# Patient Record
Sex: Female | Born: 1940 | Race: Black or African American | Hispanic: No | Smoking: Never smoker
Health system: Southern US, Community
[De-identification: ages and names within clinical notes are randomized; demographics above are authoritative.]

## PROBLEM LIST (undated history)

## (undated) DIAGNOSIS — I10 Essential (primary) hypertension: Secondary | ICD-10-CM

## (undated) DIAGNOSIS — Z8049 Family history of malignant neoplasm of other genital organs: Secondary | ICD-10-CM

## (undated) DIAGNOSIS — Z800 Family history of malignant neoplasm of digestive organs: Secondary | ICD-10-CM

## (undated) DIAGNOSIS — B019 Varicella without complication: Secondary | ICD-10-CM

## (undated) DIAGNOSIS — Z806 Family history of leukemia: Secondary | ICD-10-CM

## (undated) DIAGNOSIS — Z8042 Family history of malignant neoplasm of prostate: Secondary | ICD-10-CM

## (undated) DIAGNOSIS — C50919 Malignant neoplasm of unspecified site of unspecified female breast: Secondary | ICD-10-CM

## (undated) DIAGNOSIS — Z923 Personal history of irradiation: Secondary | ICD-10-CM

## (undated) DIAGNOSIS — J302 Other seasonal allergic rhinitis: Secondary | ICD-10-CM

## (undated) HISTORY — DX: Varicella without complication: B01.9

## (undated) HISTORY — DX: Family history of leukemia: Z80.6

## (undated) HISTORY — DX: Other seasonal allergic rhinitis: J30.2

## (undated) HISTORY — DX: Family history of malignant neoplasm of digestive organs: Z80.0

## (undated) HISTORY — DX: Family history of malignant neoplasm of other genital organs: Z80.49

## (undated) HISTORY — PX: BREAST LUMPECTOMY: SHX2

## (undated) HISTORY — DX: Family history of malignant neoplasm of prostate: Z80.42

## (undated) HISTORY — DX: Malignant neoplasm of unspecified site of unspecified female breast: C50.919

---

## 1990-04-11 HISTORY — PX: BREAST BIOPSY: SHX20

## 1998-05-13 ENCOUNTER — Encounter: Payer: Self-pay | Admitting: Emergency Medicine

## 1998-05-13 ENCOUNTER — Emergency Department (HOSPITAL_COMMUNITY): Admission: EM | Admit: 1998-05-13 | Discharge: 1998-05-13 | Payer: Self-pay | Admitting: Emergency Medicine

## 2000-06-01 ENCOUNTER — Emergency Department (HOSPITAL_COMMUNITY): Admission: EM | Admit: 2000-06-01 | Discharge: 2000-06-01 | Payer: Self-pay | Admitting: Emergency Medicine

## 2001-09-06 ENCOUNTER — Encounter: Admission: RE | Admit: 2001-09-06 | Discharge: 2001-09-06 | Payer: Self-pay | Admitting: Radiation Oncology

## 2002-10-08 LAB — HM MAMMOGRAPHY

## 2003-11-09 ENCOUNTER — Emergency Department (HOSPITAL_COMMUNITY): Admission: EM | Admit: 2003-11-09 | Discharge: 2003-11-09 | Payer: Self-pay | Admitting: Family Medicine

## 2005-11-14 ENCOUNTER — Emergency Department (HOSPITAL_COMMUNITY): Admission: EM | Admit: 2005-11-14 | Discharge: 2005-11-14 | Payer: Self-pay | Admitting: Family Medicine

## 2012-05-10 ENCOUNTER — Encounter: Payer: Self-pay | Admitting: Family Medicine

## 2012-06-07 ENCOUNTER — Ambulatory Visit (INDEPENDENT_AMBULATORY_CARE_PROVIDER_SITE_OTHER): Payer: Medicare Other | Admitting: Family Medicine

## 2012-06-07 ENCOUNTER — Telehealth: Payer: Self-pay | Admitting: Family Medicine

## 2012-06-07 ENCOUNTER — Encounter: Payer: Self-pay | Admitting: Family Medicine

## 2012-06-07 VITALS — BP 200/100 | HR 75 | Temp 98.6°F | Ht 65.25 in | Wt 147.0 lb

## 2012-06-07 DIAGNOSIS — E785 Hyperlipidemia, unspecified: Secondary | ICD-10-CM

## 2012-06-07 DIAGNOSIS — R030 Elevated blood-pressure reading, without diagnosis of hypertension: Secondary | ICD-10-CM

## 2012-06-07 DIAGNOSIS — Z131 Encounter for screening for diabetes mellitus: Secondary | ICD-10-CM

## 2012-06-07 DIAGNOSIS — R739 Hyperglycemia, unspecified: Secondary | ICD-10-CM

## 2012-06-07 DIAGNOSIS — R7309 Other abnormal glucose: Secondary | ICD-10-CM

## 2012-06-07 DIAGNOSIS — Z7689 Persons encountering health services in other specified circumstances: Secondary | ICD-10-CM

## 2012-06-07 DIAGNOSIS — Z7189 Other specified counseling: Secondary | ICD-10-CM

## 2012-06-07 LAB — BASIC METABOLIC PANEL
BUN: 11 mg/dL (ref 6–23)
CO2: 29 mEq/L (ref 19–32)
Calcium: 9.4 mg/dL (ref 8.4–10.5)
Chloride: 103 mEq/L (ref 96–112)
Creatinine, Ser: 0.7 mg/dL (ref 0.4–1.2)
GFR: 113.5 mL/min (ref >60.00)
Glucose, Bld: 79 mg/dL (ref 70–99)
Potassium: 4.7 mEq/L (ref 3.5–5.1)
Sodium: 139 mEq/L (ref 135–145)

## 2012-06-07 LAB — LIPID PANEL
Cholesterol: 182 mg/dL (ref 0–200)
HDL: 65.5 mg/dL (ref >39.00)
LDL Cholesterol: 107 mg/dL — ABNORMAL HIGH (ref 0–99)
Total CHOL/HDL Ratio: 3
Triglycerides: 48 mg/dL (ref 0.0–149.0)
VLDL: 9.6 mg/dL (ref 0.0–40.0)

## 2012-06-07 LAB — HEMOGLOBIN A1C: Hgb A1c MFr Bld: 5.5 % (ref 4.6–6.5)

## 2012-06-07 NOTE — Telephone Encounter (Signed)
Labs look good.

## 2012-06-07 NOTE — Patient Instructions (Addendum)
-  We have ordered labs or studies at this visit. It can take up to 1-2 weeks for results and processing. We will contact you with instructions IF your results are abnormal. Normal results will be released to your [facility]. If you have not heard from [place] or can not find your results in [facility] in 2 weeks please contact our office.  -PLEASE SIGN UP FOR MYCHART TODAY   We recommend the following healthy lifestyle measures: - eat a healthy diet consisting of lots of vegetables, fruits, beans, nuts, seeds, healthy meats such as white chicken and fish and whole grains.  - avoid fried foods, fast food, processed foods, sodas, red meet and other fattening foods.  - get a least 150 minutes of aerobic exercise per week.   Follow up in: 1 month for you blood pressure or sooner if any symptoms

## 2012-06-07 NOTE — Progress Notes (Signed)
Chief Complaint  Patient presents with  . Establish Care    HPI:  Kristina Bridges is here to establish care.  Last PCP and physical: Used to see Dr. Dennard Nip - but has not seen a doctor at all in abut eight years.   Has the following chronic problems and concerns today:  There is no problem list on file for this patient.  Hx of breast cancer: -reports she used to get mammogram and right after a normal mammogram she found a breast lump in the shower and was dx with breast cancer. Refuses mammograms. She does self breast exams several times a month since in the shower.  Health Maintenance: -refuses all health maintenance measure  ROS: See pertinent positives and negatives per HPI. Reports feels great - denies CP, SOB, DOE, swelling, palpitations, changes in bowels or weight, vision changes, HAs,  urinary symptoms.  Past Medical History  Diagnosis Date  . Chicken pox   . Seasonal allergies   . Breast cancer     remote, tx with surgery and radiation - no recurrence    Family History  Problem Relation Age of Onset  . Hypertension    . Hypertension Mother   . Cancer Sister   . Cancer Brother     throat    History   Social History  . Marital Status: Divorced    Spouse Name: N/A    Number of Children: N/A  . Years of Education: N/A   Social History Main Topics  . Smoking status: Never Smoker   . Smokeless tobacco: None  . Alcohol Use: No  . Drug Use: None  . Sexually Active: None   Other Topics Concern  . None   Social History Narrative   Work or School: retired, used to work in [employer] Situation: lives alone - raised a grandson but he has moved out, he comes to visit, has a lot of good friends      Spiritual Beliefs: Christian, haas good support      Lifestyle: exercises every day and eat healthy             Current outpatient prescriptions:Calcium-Magnesium-Vitamin D 500-50-100 MG-MG-UNIT CHEW, Chew by mouth., Disp: , Rfl:    EXAM:  Filed Vitals:   06/07/12 1244  BP: 200/100  Pulse: 75  Temp: 98.6 F (37 C)    Body mass index is 24.29 kg/(m^2).  GENERAL: vitals reviewed and listed above, alert, oriented, appears well hydrated and in no acute distress  HEENT: atraumatic, conjunttiva clear, no obvious abnormalities on inspection of external nose and ears  NECK: no obvious masses on inspection  LUNGS: clear to auscultation bilaterally, no wheezes, rales or rhonchi, good air movement  CV: HRRR, no peripheral edema  MS: moves all extremities without noticeable abnormality  PSYCH: pleasant and cooperative, no obvious depression or anxiety  ASSESSMENT AND PLAN:  Discussed the following assessment and plan:  Elevated blood pressure - Plan: Lipid Panel, Basic metabolic panel, Hemoglobin A1c  Establishing care with new doctor, encounter for  Diabetes mellitus screening - Plan: Hemoglobin A1c  Hyperlipemia - Plan: Lipid Panel  Hyperglycemia - Plan: Hemoglobin A1c  -We reviewed the PMH, PSH, FH, SH, Meds and Allergies. -her blood pressure is high and is 172/100 on recheck - I advised we check labs and start a blood pressure medication today. She has a FH of HTN. She refused to start medication and wants to work on exercising more and  a healthier diet. She is aware of risks of untreated blood pressure including heart disease, stroke and other issues. She will follow up in 2-4 weeks to recheck and advised starting blood pressure medication is remains elevated. -NON FASTING LABS TODAY -she refuses all health maintenance advice and will continue self breast exams.  -Patient advised to return or notify a doctor immediately if symptoms worsen or persist or new concerns arise.  Patient Instructions  -We have ordered labs or studies at this visit. It can take up to 1-2 weeks for results and processing. We will contact you with instructions IF your results are abnormal. Normal results will be released to  your [facility]. If you have not heard from [place] or can not find your results in [facility] in 2 weeks please contact our office.  -PLEASE SIGN UP FOR MYCHART TODAY   We recommend the following healthy lifestyle measures: - eat a healthy diet consisting of lots of vegetables, fruits, beans, nuts, seeds, healthy meats such as white chicken and fish and whole grains.  - avoid fried foods, fast food, processed foods, sodas, red meet and other fattening foods.  - get a least 150 minutes of aerobic exercise per week.   Follow up in: 1 month for you blood pressure or sooner if any symptoms      [name]

## 2012-06-08 NOTE — Telephone Encounter (Signed)
Called and spoke with pt and pt is aware.  

## 2012-06-08 NOTE — Telephone Encounter (Signed)
Left a message for return call.  

## 2012-07-09 ENCOUNTER — Ambulatory Visit: Payer: Medicare Other | Admitting: Family Medicine

## 2013-06-14 ENCOUNTER — Ambulatory Visit: Payer: Medicare Other | Admitting: Family Medicine

## 2014-02-10 ENCOUNTER — Encounter: Payer: Self-pay | Admitting: Family Medicine

## 2014-02-20 ENCOUNTER — Ambulatory Visit (INDEPENDENT_AMBULATORY_CARE_PROVIDER_SITE_OTHER): Payer: Medicare Other | Admitting: Family Medicine

## 2014-02-20 ENCOUNTER — Telehealth: Payer: Self-pay | Admitting: Family Medicine

## 2014-02-20 ENCOUNTER — Encounter: Payer: Self-pay | Admitting: Family Medicine

## 2014-02-20 VITALS — BP 142/98 | HR 76 | Temp 97.3°F | Ht 65.25 in | Wt 141.6 lb

## 2014-02-20 DIAGNOSIS — Z0000 Encounter for general adult medical examination without abnormal findings: Secondary | ICD-10-CM

## 2014-02-20 DIAGNOSIS — G3184 Mild cognitive impairment, so stated: Secondary | ICD-10-CM

## 2014-02-20 DIAGNOSIS — I10 Essential (primary) hypertension: Secondary | ICD-10-CM

## 2014-02-20 MED ORDER — AMLODIPINE BESYLATE 5 MG PO TABS: 5.0000 mg | ORAL_TABLET | Freq: Every day | ORAL | Status: DC

## 2014-02-20 NOTE — Patient Instructions (Addendum)
BEFORE YOU LEAVE: -schedule follow up in 1 month to recheck you blood pressure -lab check  START taking the new medication - NORVASC 5mg  daily  Please see a lawyer and/or go to this website to help you with advanced directives and designating a health care power of attorney so that your wishes will be followed should you become too ill to make your own medical decisions.  Proor.no  We recommend the following healthy lifestyle measures: - eat a healthy diet consisting of lots of vegetables, fruits, beans, nuts, seeds, healthy meats such as white chicken and fish and whole grains.  - avoid fried foods, fast food, processed foods, sodas, red meet and other fattening foods.  - get a least 150 minutes of aerobic exercise per week.

## 2014-02-20 NOTE — Telephone Encounter (Signed)
Sent!

## 2014-02-20 NOTE — Telephone Encounter (Signed)
Pharm following up for pt.  Pt is supposed to start NORVASC 5mg  daily Can you call to [facility]

## 2014-02-20 NOTE — Progress Notes (Signed)
Pre visit review using our clinic review tool, if applicable. No additional management support is needed unless otherwise documented below in the visit note. 

## 2014-02-20 NOTE — Progress Notes (Signed)
Medicare Annual Preventive Care Visit  (initial annual wellness or annual wellness exam)  Concerns and/or follow up today:  In the past has refused all preventive measures.  Hx of breast cancer: -reports she used to get mammogram and right after a normal mammogram she found a breast lump in the shower and was dx with breast cancer. Refuses mammograms. She does self breast exams several times a month since in the shower.  HTN: -refused treatment last visit even after warned of serious risks including stroke, heart disease, kidney disease and death, did not follow up as advised -she is very nervous about side effects with BP -denies: CP, SOB, DOE, swelling, palpitations, HA, vision changes  ROS: negative for report of fevers, unintentional weight loss, vision changes, vision loss, hearing loss or change, chest pain, sob, hemoptysis, melena, hematochezia, hematuria, genital discharge or lesions, falls, bleeding or bruising, loc, thoughts of suicide or self harm, memory loss  1.) Patient-completed health risk assessment  - completed and reviewed, see scanned documentation  2.) Review of Medical History: -PMH, PSH, Family History and current specialty and care providers reviewed and updated and listed below  - see scanned in document in chart and below  Past Medical History  Diagnosis Date  . Chicken pox   . Seasonal allergies   . Breast cancer     remote, tx with surgery and radiation - no recurrence    Past Surgical History  Procedure Laterality Date  . Breast biopsy  1992    History   Social History  . Marital Status: Divorced    Spouse Name: N/A    Number of Children: N/A  . Years of Education: N/A   Occupational History  . Not on file.   Social History Main Topics  . Smoking status: Never Smoker   . Smokeless tobacco: Not on file  . Alcohol Use: No  . Drug Use: Not on file  . Sexual Activity: Not on file   Other Topics Concern  . Not on file   Social History  Narrative   Work or School: retired, used to work in [employer] Situation: lives alone - raised a grandson but he has moved out, he comes to visit, has a lot of good friends      Spiritual Beliefs: Christian, haas good support      Lifestyle: exercises every day and eat healthy             The patient has a family history of  3.) Review of functional ability and level of safety:  Any difficulty hearing? NO  History of falling? NO  Any trouble with IADLs - using a phone, using transportation, grocery shopping, preparing meals, doing housework, doing laundry, taking medications and managing money? NO  Advance Directives? NO  See summary of recommendations in Patient Instructions below.  4.) Physical Exam Filed Vitals:   02/20/14 0913  BP: 142/98  Pulse: 76  Temp: 97.3 F (36.3 C)   Estimated body mass index is 23.39 kg/(m^2) as calculated from the following:   Height as of this encounter: 5' 5.25" (1.657 m).   Weight as of this encounter: 141 lb 9.6 oz (64.229 kg).  EKG (optional): deferred  General: alert, appear well hydrated and in no acute distress  HEENT: visual acuity grossly intact  CV: HRRR  Lungs: CTA bilaterally  Psych: pleasant and cooperative, no obvious depression or anxiety  Mini Cog: 1. Patient instructed to listen carefully and  repeat the following: Apple Watch    Penny  2. Clock drawing test was administered: NORMAL     ABNORMAL  3. Recall of three words  Scoring:  Number of words recalled? 2/3 If 3 STOP = negative screen for cognitive impairment If 1-2 --> is CDT normal or abnormal? Mildly abnormal.  Patient Score: POS  See patient instructions for recommendations.  Education and counseling regarding the above review of health provided with a plan for the following: -see scanned patient completed form for further details -fall prevention strategies discussed  -healthy lifestyle discussed -importance and  resources for completing advanced directives discussed, information provieded -see patient instructions below for any other recommendations provided  4)The following written screening schedule of preventive measures were reviewed with assessment and plan made per below, orders and patient instructions:      AAA screening: n/a     Alcohol screening: done     Obesity Screening and counseling: done     STI screening: offered, declined     Tobacco Screening: done       Pneumococcal (PPSV23 -one dose after 64, one before if risk factors), influenza yearly and hepatitis B vaccines (if high risk - end stage renal disease, IV drugs, homosexual men, live in home for mentally retarded, hemophilia receiving factors) ASSESSMENT/PLAN: refuses all vaccines      Screening mammograph (yearly if >40) ASSESSMENT/PLAN: refused      Screening Pap smear/pelvic exam (q2 years) ASSESSMENT/PLAN: n/a      Prostate cancer screening ASSESSMENT/PLAN: n/a      Colorectal cancer screening (FOBT yearly or flex sig q4y or colonoscopy q10y or barium enema q4y) ASSESSMENT/PLAN: refuses      Diabetes outpatient self-management training services ASSESSMENT/PLAN: n/a      Bone mass measurements(covered q2y if indicated - estrogen def, osteoporosis, hyperparathyroid, vertebral abnormalities, osteoporosis or steroids) ASSESSMENT/PLAN: refused      Screening for glaucoma(q1y if high risk - diabetes, FH, AA and > 50 or hispanic and > 65) ASSESSMENT/PLAN: sees eye doctor      Medical nutritional therapy for individuals with diabetes or renal disease ASSESSMENT/PLAN: n/a      Cardiovascular screening blood tests (lipids q5y) ASSESSMENT/PLAN: done      Diabetes screening tests ASSESSMENT/PLAN: done   7.) Summary: -risk factors and conditions per above assessment were discussed and treatment, recommendations and referrals were offered per documentation above and orders and patient instructions.  Medicare annual  wellness visit, initial  Essential hypertension -we again discussed her significant HTN and she finally agreed to start a medication -after discussion risks and benefits she opted for norvasc -she wants to keep working on diet and exercise too  Mild cognitive impairment -discussed, she refused any further evaluation for this  Patient Instructions  BEFORE YOU LEAVE: -schedule follow up in 1 month to recheck you blood pressure -lab check  START taking the new medication - NORVASC 5mg  daily  Please see a lawyer and/or go to this website to help you with advanced directives and designating a health care power of attorney so that your wishes will be followed should you become too ill to make your own medical decisions.  Proor.no  We recommend the following healthy lifestyle measures: - eat a healthy diet consisting of lots of vegetables, fruits, beans, nuts, seeds, healthy meats such as white chicken and fish and whole grains.  - avoid fried foods, fast food, processed foods, sodas, red meet and other fattening foods.  - get a least 150 minutes  of aerobic exercise per week.

## 2014-02-25 NOTE — Progress Notes (Signed)
Scheduled for appt.

## 2014-02-26 ENCOUNTER — Telehealth: Payer: Self-pay | Admitting: Family Medicine

## 2014-02-26 NOTE — Telephone Encounter (Signed)
emmi emailed °

## 2014-03-21 ENCOUNTER — Encounter: Payer: Self-pay | Admitting: Family Medicine

## 2014-03-21 ENCOUNTER — Ambulatory Visit (INDEPENDENT_AMBULATORY_CARE_PROVIDER_SITE_OTHER): Payer: Medicare Other | Admitting: Family Medicine

## 2014-03-21 VITALS — BP 126/82 | HR 68 | Temp 98.1°F | Ht 65.25 in | Wt 140.9 lb

## 2014-03-21 DIAGNOSIS — I10 Essential (primary) hypertension: Secondary | ICD-10-CM

## 2014-03-21 NOTE — Progress Notes (Signed)
Pre visit review using our clinic review tool, if applicable. No additional management support is needed unless otherwise documented below in the visit note. 

## 2014-03-21 NOTE — Progress Notes (Signed)
  HPI:  Follow up:   HTN: -finally agreed to start medication 02/2014 with Norvasc 5 mg -reports: doing well, taking daily  -denies:CP, SOB, swelling -she reports she is always anxious when comes, reports home BP in 1teen-124 /80s at home  ROS: See pertinent positives and negatives per HPI.  Past Medical History  Diagnosis Date  . Chicken pox   . Seasonal allergies   . Breast cancer     remote, tx with surgery and radiation - no recurrence    Past Surgical History  Procedure Laterality Date  . Breast biopsy  1992    Family History  Problem Relation Age of Onset  . Hypertension    . Hypertension Mother   . Cancer Sister   . Cancer Brother     throat    History   Social History  . Marital Status: Divorced    Spouse Name: N/A    Number of Children: N/A  . Years of Education: N/A   Social History Main Topics  . Smoking status: Never Smoker   . Smokeless tobacco: None  . Alcohol Use: No  . Drug Use: None  . Sexual Activity: None   Other Topics Concern  . None   Social History Narrative   Work or School: retired, used to work in [employer] Situation: lives alone - raised a grandson but he has moved out, he comes to visit, has a lot of good friends      Spiritual Beliefs: Christian, haas good support      Lifestyle: exercises every day and eat healthy             Current outpatient prescriptions: acetaminophen (TYLENOL) 500 MG tablet, Take 500 mg by mouth as needed., Disp: , Rfl: ;  amLODipine (NORVASC) 5 MG tablet, Take 1 tablet (5 mg total) by mouth daily., Disp: 90 tablet, Rfl: 3;  Multiple Vitamin (MULTIVITAMIN) capsule, Take 1 capsule by mouth daily., Disp: , Rfl:   EXAM:  Filed Vitals:   03/21/14 1259  BP: 126/82  Pulse: 68  Temp: 98.1 F (36.7 C)    Body mass index is 23.28 kg/(m^2).  GENERAL: vitals reviewed and listed above, alert, oriented, appears well hydrated and in no acute distress  HEENT: atraumatic, conjunttiva  clear, no obvious abnormalities on inspection of external nose and ears  NECK: no obvious masses on inspection  LUNGS: clear to auscultation bilaterally, no wheezes, rales or rhonchi, good air movement  CV: HRRR, no peripheral edema  MS: moves all extremities without noticeable abnormality  PSYCH: pleasant and cooperative, no obvious depression or anxiety  ASSESSMENT AND PLAN:  Discussed the following assessment and plan:  Essential hypertension  -BP great on recheck -she did not do BMP last visit - advised to get today - she refused -reviewed HM and she refused -follow up 4-6 months -Patient advised to return or notify a doctor immediately if symptoms worsen or persist or new concerns arise.  There are no Patient Instructions on file for this visit.   Colin Benton R.

## 2014-08-29 ENCOUNTER — Telehealth: Payer: Self-pay | Admitting: *Deleted

## 2014-08-29 NOTE — Telephone Encounter (Signed)
Left message for pt to call back.  Need to know if pt had mammogram, when and where

## 2014-09-18 ENCOUNTER — Encounter: Payer: Self-pay | Admitting: Family Medicine

## 2014-09-18 ENCOUNTER — Ambulatory Visit (INDEPENDENT_AMBULATORY_CARE_PROVIDER_SITE_OTHER): Payer: Medicare Other | Admitting: Family Medicine

## 2014-09-18 VITALS — BP 130/82 | HR 68 | Temp 97.6°F | Ht 65.25 in | Wt 143.4 lb

## 2014-09-18 DIAGNOSIS — J309 Allergic rhinitis, unspecified: Secondary | ICD-10-CM | POA: Diagnosis not present

## 2014-09-18 DIAGNOSIS — Z1239 Encounter for other screening for malignant neoplasm of breast: Secondary | ICD-10-CM | POA: Diagnosis not present

## 2014-09-18 DIAGNOSIS — I10 Essential (primary) hypertension: Secondary | ICD-10-CM | POA: Diagnosis not present

## 2014-09-18 NOTE — Progress Notes (Signed)
Pre visit review using our clinic review tool, if applicable. No additional management support is needed unless otherwise documented below in the visit note. 

## 2014-09-18 NOTE — Progress Notes (Signed)
HPI:  Follow up:  HTN/Mild HLD: -finally agreed to start medication 02/2014 with Norvasc 5 mg -reports: doing well, taking daily  -denies:CP, SOB, swelling -she reports she is always anxious when comes, reports  home BP in 1teen-124 /80s at home -she exercises and walks daily  Seasonal allergies: -reports: doing well -denies: symptoms this season  HM: -mammo: she was reluctant but she did agree and referral placed -dexa: refused .pneumonia vaccine: refused -Tdap: refused -shingles: refused  ROS: See pertinent positives and negatives per HPI.  Past Medical History  Diagnosis Date  . Chicken pox   . Seasonal allergies   . Breast cancer     remote, tx with surgery and radiation - no recurrence    Past Surgical History  Procedure Laterality Date  . Breast biopsy  1992    Family History  Problem Relation Age of Onset  . Hypertension    . Hypertension Mother   . Cancer Sister   . Cancer Brother     throat    History   Social History  . Marital Status: Divorced    Spouse Name: N/A  . Number of Children: N/A  . Years of Education: N/A   Social History Main Topics  . Smoking status: Never Smoker   . Smokeless tobacco: Not on file  . Alcohol Use: No  . Drug Use: Not on file  . Sexual Activity: Not on file   Other Topics Concern  . None   Social History Narrative   Work or School: retired, used to work in [employer] Situation: lives alone - raised a grandson but he has moved out, he comes to visit, has a lot of good friends - two daughters sylvia and zena with health issues      Spiritual Beliefs: Christian, haas good support      Lifestyle: exercises every day and eat healthy              Current outpatient prescriptions:  .  acetaminophen (TYLENOL) 500 MG tablet, Take 500 mg by mouth as needed., Disp: , Rfl:  .  amLODipine (NORVASC) 5 MG tablet, Take 1 tablet (5 mg total) by mouth daily., Disp: 90 tablet, Rfl: 3 .  Multiple  Vitamin (MULTIVITAMIN) capsule, Take 1 capsule by mouth daily., Disp: , Rfl:   EXAM:  Filed Vitals:   09/18/14 1251  BP: 130/82  Pulse: 68  Temp: 97.6 F (36.4 C)    Body mass index is 23.69 kg/(m^2).  GENERAL: vitals reviewed and listed above, alert, oriented, appears well hydrated and in no acute distress  HEENT: atraumatic, conjunttiva clear, no obvious abnormalities on inspection of external nose and ears  NECK: no obvious masses on inspection  LUNGS: clear to auscultation bilaterally, no wheezes, rales or rhonchi, good air movement  CV: HRRR, no peripheral edema  MS: moves all extremities without noticeable abnormality  PSYCH: pleasant and cooperative, no obvious depression or anxiety  ASSESSMENT AND PLAN:  Discussed the following assessment and plan:  Essential hypertension  Breast cancer screening - Plan: MM Digital Screening  Allergic rhinitis, unspecified allergic rhinitis type  -she declined labs and most preventive maintenance  -she agreed to come fasting in 6 months -continue current medications -Patient advised to return or notify a doctor immediately if symptoms worsen or persist or new concerns arise.  Patient Instructions  BEFORE YOU LEAVE: -Medicare exam in 6 months - come fasting and we will do labs at that visit  We recommend the following healthy lifestyle measures: - eat a healthy diet consisting of lots of vegetables, fruits, beans, nuts, seeds, healthy meats such as white chicken and fish   - avoid fried foods, starches, sweets, fast food, processed foods, sodas, red meet and other fattening foods.  - get a least 150 minutes of aerobic exercise per week.       Colin Benton R.

## 2014-09-18 NOTE — Patient Instructions (Signed)
BEFORE YOU LEAVE: -Medicare exam in 6 months - come fasting and we will do labs at that visit  We recommend the following healthy lifestyle measures: - eat a healthy diet consisting of lots of vegetables, fruits, beans, nuts, seeds, healthy meats such as white chicken and fish   - avoid fried foods, starches, sweets, fast food, processed foods, sodas, red meet and other fattening foods.  - get a least 150 minutes of aerobic exercise per week.

## 2015-02-10 ENCOUNTER — Other Ambulatory Visit: Payer: Self-pay | Admitting: Family Medicine

## 2015-03-12 ENCOUNTER — Encounter: Payer: Self-pay | Admitting: Family Medicine

## 2015-03-12 ENCOUNTER — Ambulatory Visit (INDEPENDENT_AMBULATORY_CARE_PROVIDER_SITE_OTHER): Payer: Medicare Other | Admitting: Family Medicine

## 2015-03-12 VITALS — BP 126/78 | HR 72 | Temp 98.5°F | Ht 65.0 in | Wt 146.3 lb

## 2015-03-12 DIAGNOSIS — I10 Essential (primary) hypertension: Secondary | ICD-10-CM

## 2015-03-12 DIAGNOSIS — E785 Hyperlipidemia, unspecified: Secondary | ICD-10-CM | POA: Diagnosis not present

## 2015-03-12 DIAGNOSIS — Z0000 Encounter for general adult medical examination without abnormal findings: Secondary | ICD-10-CM | POA: Diagnosis not present

## 2015-03-12 DIAGNOSIS — E875 Hyperkalemia: Secondary | ICD-10-CM

## 2015-03-12 LAB — LIPID PANEL
CHOLESTEROL: 205 mg/dL — AB (ref 0–200)
HDL: 58.6 mg/dL (ref >39.00)
LDL CALC: 136 mg/dL — AB (ref 0–99)
NonHDL: 146.4
TRIGLYCERIDES: 54 mg/dL (ref 0.0–149.0)
Total CHOL/HDL Ratio: 3
VLDL: 10.8 mg/dL (ref 0.0–40.0)

## 2015-03-12 LAB — BASIC METABOLIC PANEL
BUN: 18 mg/dL (ref 6–23)
CHLORIDE: 104 meq/L (ref 96–112)
CO2: 32 meq/L (ref 19–32)
Calcium: 10.2 mg/dL (ref 8.4–10.5)
Creatinine, Ser: 0.75 mg/dL (ref 0.40–1.20)
GFR: 97.18 mL/min (ref >60.00)
GLUCOSE: 89 mg/dL (ref 70–99)
POTASSIUM: 5.6 meq/L — AB (ref 3.5–5.1)
Sodium: 141 mEq/L (ref 135–145)

## 2015-03-12 NOTE — Progress Notes (Signed)
Pre visit review using our clinic review tool, if applicable. No additional management support is needed unless otherwise documented below in the visit note. 

## 2015-03-12 NOTE — Patient Instructions (Signed)
BEFORE YOU LEAVE: -labs -follow up yearly for physical  Please see a lawyer and/or go to this website to help you with advanced directives and designating a health care power of attorney so that your wishes will be followed should you become too ill to make your own medical decisions.  [email or web address]   Vit D3 [phone] IU daily and adequate dietary intake of calcium (1200mg ) along with continued regular weight bearing exercise  Please schedule your mammogram  -We have ordered labs or studies at this visit. It can take up to 1-2 weeks for results and processing. We will contact you with instructions IF your results are abnormal. Normal results will be released to your [facility]. If you have not heard from [place] or can not find your results in [facility] in 2 weeks please contact our office.  We recommend the following healthy lifestyle measures: - eat a healthy whole foods diet consisting of regular small meals composed of vegetables, fruits, beans, nuts, seeds, healthy meats such as white chicken and fish and whole grains.  - avoid sweets, white starchy foods, fried foods, fast food, processed foods, sodas, red meet and other fattening foods.  - get a least 150-300 minutes of aerobic exercise per week.

## 2015-03-12 NOTE — Progress Notes (Signed)
Medicare Annual Preventive Care Visit  (initial annual wellness or annual wellness exam)  Concerns and/or follow up today:  HTN/Mild HLD: -finally agreed to start medication 02/2014 with Norvasc 5 mg -reports: doing well, taking med daily  -denies:CP, SOB, swelling -she reports she is always anxious when comes, reports home BP good -she exercises and walks daily  Seasonal allergies: -reports: doing well -denies: symptoms this season   ROS: negative for report of fevers, unintentional weight loss, vision changes, vision loss, hearing loss or change, chest pain, sob, hemoptysis, melena, hematochezia, hematuria, genital discharge or lesions, falls, bleeding or bruising, loc, thoughts of suicide or self harm, memory loss  1.) Patient-completed health risk assessment  - completed and reviewed, see scanned documentation  2.) Review of Medical History: -PMH, PSH, Family History and current specialty and care providers reviewed and updated and listed below  - see scanned in document in chart and below  Past Medical History  Diagnosis Date  . Chicken pox   . Seasonal allergies   . Breast cancer ([place])     remote, tx with surgery and radiation - no recurrence    Past Surgical History  Procedure Laterality Date  . Breast biopsy  1992    Social History   Social History  . Marital Status: Divorced    Spouse Name: N/A  . Number of Children: N/A  . Years of Education: N/A   Occupational History  . Not on file.   Social History Main Topics  . Smoking status: Never Smoker   . Smokeless tobacco: Not on file  . Alcohol Use: No  . Drug Use: Not on file  . Sexual Activity: Not on file   Other Topics Concern  . Not on file   Social History Narrative   Work or School: retired, used to work in [employer] Situation: lives alone - raised a grandson but he has moved out, he comes to visit, has a lot of good friends - two daughters sylvia and zena with health issues       Spiritual Beliefs: Christian, haas good support      Lifestyle: exercises every day and eat healthy             Family History  Problem Relation Age of Onset  . Hypertension    . Hypertension Mother   . Cancer Sister   . Cancer Brother     throat    Current Outpatient Prescriptions on File Prior to Visit  Medication Sig Dispense Refill  . acetaminophen (TYLENOL) 500 MG tablet Take 500 mg by mouth as needed.    Marland Kitchen amLODipine (NORVASC) 5 MG tablet take 1 tablet by mouth once daily 90 tablet 1  . Multiple Vitamin (MULTIVITAMIN) capsule Take 1 capsule by mouth daily.     No current facility-administered medications on file prior to visit.     3.) Review of functional ability and level of safety:  Any difficulty hearing? YES -mild  History of falling?  NO  Any trouble with IADLs - using a phone, using transportation, grocery shopping, preparing meals, doing housework, doing laundry, taking medications and managing money?  NO  Advance Directives? NO  See summary of recommendations in Patient Instructions below.  4.) Physical Exam Filed Vitals:   03/12/15 1121  BP: 126/78  Pulse: 72  Temp: 98.5 F (36.9 C)   Estimated body mass index is 24.35 kg/(m^2) as calculated from the following:   Height as of  this encounter: 5\' 5"  (1.651 m).   Weight as of this encounter: 146 lb 4.8 oz (66.361 kg).  EKG (optional): deferred  General: alert, appear well hydrated and in no acute distress  HEENT: visual acuity grossly intact  CV: HRRR, no peripheral edema  Lungs: CTA bilaterally  Psych: pleasant and cooperative, no obvious depression or anxiety  Cognitive function grossly intact  Gait normal  GU: refused  Breast: refused  See patient instructions for recommendations.  Education and counseling regarding the above review of health provided with a plan for the following: -see scanned patient completed form for further details -fall prevention strategies  discussed  -healthy lifestyle discussed -importance and resources for completing advanced directives discussed -see patient instructions below for any other recommendations provided  4)The following written screening schedule of preventive measures were reviewed with assessment and plan made per below, orders and patient instructions:      AAA screening n/a     Alcohol screening done     Obesity Screening and counseling n/a     STI screening (Hep C if born [birth year]) declined     Tobacco Screening done       Pneumococcal (PPSV23 -one dose after 64, one before if risk factors), influenza yearly and hepatitis B vaccines (if high risk - end stage renal disease, IV drugs, homosexual men, live in home for mentally retarded, hemophilia receiving factors) ASSESSMENT/PLAN: refuses ALL vaccines      Screening mammograph (yearly if >40) ASSESSMENT/PLAN: advised, she finally agreed to do, she does regular self breast exams which is how she found her breast ca after a normal clinical exam and mammogram per her report - she refuses clinical exam      Screening Pap smear/pelvic exam (q2 years) ASSESSMENT/PLAN: refused pelvic      Prostate cancer screening ASSESSMENT/PLAN: n/a      Colorectal cancer screening (FOBT yearly or flex sig q4y or colonoscopy q10y or barium enema q4y) ASSESSMENT/PLAN: done      Diabetes outpatient self-management training services ASSESSMENT/PLAN: n/a      Bone mass measurements(covered q2y if indicated - estrogen def, osteoporosis, hyperparathyroid, vertebral abnormalities, osteoporosis or steroids) ASSESSMENT/PLAN: refuses      Screening for glaucoma(q1y if high risk - diabetes, FH, AA and > 50 or hispanic and > 65) ASSESSMENT/PLAN: wears reading glasses and agrees to schedule eye exam      Medical nutritional therapy for individuals with diabetes or renal disease ASSESSMENT/PLAN: n/a      Cardiovascular screening blood tests (lipids q5y) ASSESSMENT/PLAN:  today      Diabetes screening tests ASSESSMENT/PLAN: today   7.) Summary: -risk factors and conditions per above assessment were discussed and treatment, recommendations and referrals were offered per documentation above and orders and patient instructions.  Medicare annual wellness visit, subsequent  Essential hypertension - Plan: Basic metabolic panel  Hyperlipemia - Plan: Lipid Panel  Patient Instructions  BEFORE YOU LEAVE: -labs -follow up yearly for physical  Please see a lawyer and/or go to this website to help you with advanced directives and designating a health care power of attorney so that your wishes will be followed should you become too ill to make your own medical decisions.  [email or web address]   Vit D3 [phone] IU daily and adequate dietary intake of calcium (1200mg ) along with continued regular weight bearing exercise  Please schedule your mammogram  -We have ordered labs or studies at this visit. It can take up to 1-2 weeks for results and processing. We will  contact you with instructions IF your results are abnormal. Normal results will be released to your [facility]. If you have not heard from [place] or can not find your results in [facility] in 2 weeks please contact our office.  We recommend the following healthy lifestyle measures: - eat a healthy whole foods diet consisting of regular small meals composed of vegetables, fruits, beans, nuts, seeds, healthy meats such as white chicken and fish and whole grains.  - avoid sweets, white starchy foods, fried foods, fast food, processed foods, sodas, red meet and other fattening foods.  - get a least 150-300 minutes of aerobic exercise per week.

## 2015-03-16 NOTE — Addendum Note (Signed)
Addended by: Agnes Lawrence on: 03/16/2015 02:30 PM   Modules accepted: Orders

## 2015-04-16 ENCOUNTER — Other Ambulatory Visit (INDEPENDENT_AMBULATORY_CARE_PROVIDER_SITE_OTHER): Payer: Medicare Other

## 2015-04-16 DIAGNOSIS — E875 Hyperkalemia: Secondary | ICD-10-CM

## 2015-04-16 LAB — BASIC METABOLIC PANEL WITH GFR
BUN: 24 mg/dL — ABNORMAL HIGH (ref 6–23)
CO2: 29 meq/L (ref 19–32)
Calcium: 9.7 mg/dL (ref 8.4–10.5)
Chloride: 105 meq/L (ref 96–112)
Creatinine, Ser: 1.02 mg/dL (ref 0.40–1.20)
GFR: 68.13 mL/min
Glucose, Bld: 83 mg/dL (ref 70–99)
Potassium: 5.2 meq/L — ABNORMAL HIGH (ref 3.5–5.1)
Sodium: 141 meq/L (ref 135–145)

## 2015-08-12 ENCOUNTER — Other Ambulatory Visit: Payer: Self-pay | Admitting: Family Medicine

## 2016-02-09 ENCOUNTER — Other Ambulatory Visit: Payer: Self-pay | Admitting: Family Medicine

## 2016-02-24 LAB — FECAL OCCULT BLOOD, GUAIAC: Fecal Occult Blood: NEGATIVE

## 2016-03-03 ENCOUNTER — Emergency Department (HOSPITAL_COMMUNITY)
Admission: EM | Admit: 2016-03-03 | Discharge: 2016-03-03 | Disposition: A | Discharge status: Home / Self Care | Payer: Medicare Other | Attending: Emergency Medicine | Admitting: Emergency Medicine

## 2016-03-03 ENCOUNTER — Encounter (HOSPITAL_COMMUNITY): Payer: Self-pay | Admitting: Emergency Medicine

## 2016-03-03 DIAGNOSIS — Z853 Personal history of malignant neoplasm of breast: Secondary | ICD-10-CM | POA: Insufficient documentation

## 2016-03-03 DIAGNOSIS — I10 Essential (primary) hypertension: Secondary | ICD-10-CM | POA: Insufficient documentation

## 2016-03-03 HISTORY — DX: Essential (primary) hypertension: I10

## 2016-03-03 NOTE — ED Notes (Signed)
Alerted [place] [employer] banker, corporate) of pt b/p of 158/78

## 2016-03-03 NOTE — ED Triage Notes (Signed)
Pt sts fluctuating BP since starting to take a different color amlodapine; pt denies other complaint

## 2016-03-03 NOTE — ED Provider Notes (Signed)
[facility] DEPT Provider Note   CSN: [id number] Arrival date & time: 03/03/16  1110     History   Chief Complaint Chief Complaint  Patient presents with  . Hypertension    HPI Kristina Bridges is a 75 y.o. female.  The history is provided by the patient.  Hypertension  This is a new problem. The current episode started less than 1 hour ago. The problem occurs rarely. The problem has been resolved. Pertinent negatives include no chest pain, no abdominal pain, no headaches and no shortness of breath. Nothing aggravates the symptoms. Nothing relieves the symptoms. She has tried nothing for the symptoms. The treatment provided no relief.    Past Medical History:  Diagnosis Date  . Breast cancer ([place])    remote, tx with surgery and radiation - no recurrence  . Chicken pox   . Hypertension   . Seasonal allergies     There are no active problems to display for this patient.   Past Surgical History:  Procedure Laterality Date  . BREAST BIOPSY  1992    OB History    Gravida Para Term Preterm AB Living   3 3           SAB TAB Ectopic Multiple Live Births                   Home Medications    Prior to Admission medications   Medication Sig Start Date End Date Taking? Authorizing Provider  acetaminophen (TYLENOL) 500 MG tablet Take 500 mg by mouth as needed.    Historical Provider, MD  amLODipine (NORVASC) 5 MG tablet take 1 tablet by mouth once daily 02/09/16   Lucretia Kern, DO  Multiple Vitamin (MULTIVITAMIN) capsule Take 1 capsule by mouth daily.    Historical Provider, MD    Family History Family History  Problem Relation Age of Onset  . Hypertension    . Hypertension Mother   . Cancer Sister   . Cancer Brother     throat    Social History Social History  Substance Use Topics  . Smoking status: Never Smoker  . Smokeless tobacco: Never Used  . Alcohol use No     Allergies   Aspirin   Review of Systems Review of Systems    Constitutional: Negative for chills and fever.  HENT: Negative for ear pain and sore throat.   Eyes: Negative for pain and visual disturbance.  Respiratory: Negative for cough and shortness of breath.   Cardiovascular: Negative for chest pain, palpitations and leg swelling.  Gastrointestinal: Negative for abdominal pain and vomiting.  Genitourinary: Negative for dysuria, flank pain, hematuria and urgency.  Musculoskeletal: Negative for arthralgias and back pain.  Skin: Negative for color change and rash.  Neurological: Negative for seizures, syncope and headaches.  All other systems reviewed and are negative.    Physical Exam Updated Vital Signs BP 147/76   Pulse 82   Temp 98.4 F (36.9 C) (Oral)   Resp 18   SpO2 100%   Physical Exam  Constitutional: She is oriented to person, place, and time. She appears well-developed and well-nourished. No distress.  HENT:  Head: Normocephalic and atraumatic.  Eyes: Conjunctivae are normal.  Neck: Neck supple. No JVD present.  Cardiovascular: Normal rate and regular rhythm.  Exam reveals no gallop and no friction rub.   No murmur heard. Pulmonary/Chest: Effort normal and breath sounds normal. No respiratory distress. She has no rales.  Abdominal: Soft. There is no  tenderness.  Musculoskeletal: She exhibits no edema.  Neurological: She is alert and oriented to person, place, and time. No cranial nerve deficit or sensory deficit. She exhibits normal muscle tone. Coordination normal.  Skin: Skin is warm and dry. Capillary refill takes less than 2 seconds.  Psychiatric: She has a normal mood and affect.  Nursing note and vitals reviewed.    ED Treatments / Results  Labs (all labs ordered are listed, but only abnormal results are displayed) Labs Reviewed - No data to display  EKG  EKG Interpretation None       Radiology No results found.  Procedures Procedures (including critical care time)  Medications Ordered in  ED Medications - No data to display   Initial Impression / Assessment and Plan / ED Course  I have reviewed the triage vital signs and the nursing notes.  Pertinent labs & imaging results that were available during my care of the patient were reviewed by me and considered in my medical decision making (see chart for details).  Clinical Course   75yF with hx of HTN on amlodipine comes with complaint of high blood pressure. She took her blood pressure at home and it was 200/100. She did not take it again, and came directly here. She has no other complaints, she feels this is because her amlodipine is a different color tablet. Neurologic function is without deficit in Cn/motor/sensory/cerebellar exam, normal vision. Denies Cp, SOB, Ha, urinary changes. Here BP is at the highest 170/80. No need for treatment or further work up here, safe for DC home.  No signs of urgency or emergency. Told to recheck pressure if high with other devices and follow up with her pcp. Given strict return precautions, VSS at time of DC.  Final Clinical Impressions(s) / ED Diagnoses   Final diagnoses:  Hypertension, unspecified type    New Prescriptions New Prescriptions   No medications on file     Dewaine Conger, MD 03/03/16 1517    Leo Grosser, MD 03/04/16 [phone]

## 2016-03-16 ENCOUNTER — Encounter: Payer: Medicare Other | Admitting: Family Medicine

## 2016-03-16 NOTE — Progress Notes (Signed)
Medicare Annual Preventive Care Visit  (initial annual wellness or annual wellness exam)  Concerns and/or follow up today:  HTN/Mild HLD: -finally agreed to start medication 02/2014 with Norvasc 5 mg -she reports she is always anxious when comes, reports went to ER last month because BP was up at home, thought to be cuff issues - she felt fine -she exercises and walks daily -no cp, sob, doe, swelling or palpitations -reports insurance nurse came to her house, no symptoms, but did what sounds like was ABIs and told her mildly abnormal but that since no symptoms no tx needed and pt does not want any eval or tx for this  Seasonal allergies: -reports: doing well -denies: symptoms this season  ROS: negative for report of fevers, unintentional weight loss, vision changes, vision loss, hearing loss or change, chest pain, sob, hemoptysis, melena, hematochezia, hematuria, genital discharge or lesions, falls, bleeding or bruising, loc, thoughts of suicide or self harm, memory loss  1.) Patient-completed health risk assessment  - completed and reviewed, see scanned documentation  2.) Review of Medical History: -PMH, PSH, Family History and current specialty and care providers reviewed and updated and listed below  - see scanned in document in chart and below  Past Medical History:  Diagnosis Date  . Breast cancer ([place])    remote, tx with surgery and radiation - no recurrence  . Chicken pox   . Hypertension   . Seasonal allergies     Past Surgical History:  Procedure Laterality Date  . BREAST BIOPSY  1992    Social History   Social History  . Marital status: Divorced    Spouse name: N/A  . Number of children: N/A  . Years of education: N/A   Occupational History  . Not on file.   Social History Main Topics  . Smoking status: Never Smoker  . Smokeless tobacco: Never Used  . Alcohol use No  . Drug use: No  . Sexual activity: Not on file   Other Topics Concern  . Not on  file   Social History Narrative   Work or School: retired, used to work in [employer] Situation: lives alone - raised a grandson but he has moved out, he comes to visit, has a lot of good friends - two daughters sylvia and zena with health issues      Spiritual Beliefs: Christian, haas good support      Lifestyle: exercises every day and eat healthy             Family History  Problem Relation Age of Onset  . Hypertension    . Hypertension Mother   . Cancer Sister   . Cancer Brother     throat    Current Outpatient Prescriptions on File Prior to Visit  Medication Sig Dispense Refill  . acetaminophen (TYLENOL) 500 MG tablet Take 500 mg by mouth as needed.    Marland Kitchen amLODipine (NORVASC) 5 MG tablet take 1 tablet by mouth once daily 90 tablet 0  . Multiple Vitamin (MULTIVITAMIN) capsule Take 1 capsule by mouth daily.     No current facility-administered medications on file prior to visit.      3.) Review of functional ability and level of safety:  Any difficulty hearing?  See scanned documentation  History of falling?  See scanned documentation  Any trouble with IADLs - using a phone, using transportation, grocery shopping, preparing meals, doing housework, doing laundry, taking medications and managing  money?  See scanned documentation  Advance Directives?  Discussed briefly and offered more resources and detailed discussion with our trained staff.  She reports she is working on this and declines further assistance.  See summary of recommendations in Patient Instructions below.  4.) Physical Exam Vitals:   03/17/16 0951  BP: 122/72  Pulse: 90  Temp: 98.2 F (36.8 C)   Estimated body mass index is 22.59 kg/m as calculated from the following:   Height as of this encounter: 5\' 4"  (1.626 m).   Weight as of this encounter: 131 lb 9.6 oz (59.7 kg).  EKG (optional): deferred  General: alert, appear well hydrated and in no acute distress  HEENT:  visual acuity grossly intact  CV: HRRR, pedal pulses normal, no LE edema  Lungs: CTA bilaterally  Psych: pleasant and cooperative, no obvious depression or anxiety  Cognitive function grossly intact  See patient instructions for recommendations.  Education and counseling regarding the above review of health provided with a plan for the following: -see scanned patient completed form for further details -fall prevention strategies discussed  -healthy lifestyle discussed -importance and resources for completing advanced directives discussed -see patient instructions below for any other recommendations provided  4)The following written screening schedule of preventive measures were reviewed with assessment and plan made per below, orders and patient instructions:       Alcohol screening done     Obesity Screening and counseling done     STI screening (Hep C if born [birth year]) offered and per pt wishes     Tobacco Screening done       Pneumococcal (PPSV23 -one dose after 64, one before if risk factors), influenza yearly and hepatitis B vaccines (if high risk - end stage renal disease, IV drugs, homosexual men, live in home for mentally retarded, hemophilia receiving factors) ASSESSMENT/PLAN: refuses ALL vaccines      Screening mammograph (yearly if >40) ASSESSMENT/PLAN: does self breast exams, did not want to do mammo or CBE in the past - she adamantly refused exam or mammo today and only agrees to self breast exams      Screening Pap smear/pelvic exam (q2 years) ASSESSMENT/PLAN: n/a, declined      Colorectal cancer screening (FOBT yearly or flex sig q4y or colonoscopy q10y or barium enema q4y) ASSESSMENT/PLAN:  Done 2015 per her report -declines any further      Bone mass measurements(covered q2y if indicated - estrogen def, osteoporosis, hyperparathyroid, vertebral abnormalities, osteoporosis or steroids) ASSESSMENT/PLAN: refused last year - refuses again      Screening for  glaucoma(q1y if high risk - diabetes, FH, AA and > 50 or hispanic and > 65) ASSESSMENT/PLAN: used to see eye doctor, reports normal eyes other then uses reading glasses, agrees to schedule exam      Medical nutritional therapy for individuals with diabetes or renal disease ASSESSMENT/PLAN: see orders      Cardiovascular screening blood tests (lipids q5y) ASSESSMENT/PLAN: see orders and labs      Diabetes screening tests ASSESSMENT/PLAN: see orders and labs   7.) Summary:  Medicare annual wellness visit, subsequent -risk factors and conditions per above assessment were discussed and treatment, recommendations and referrals were offered per documentation above and orders and patient instructions. -she refuses most preventive care measures  Essential hypertension - Plan: Basic metabolic panel, CBC (no diff) -she did not stop multivitamin, advise could stop and take vit d only  Hyperlipidemia, unspecified hyperlipidemia type - Plan: Lipid panel  Patient Instructions  BEFORE  YOU LEAVE: -labs -follow up: 4 months  Vit D3 [phone] IU daily - source naturals drops is a good product according to a recent consumer lab report.  Please see a lawyer and/or go to this website to help you with advanced directives and designating a health care power of attorney so that your wishes will be followed should you become too ill to make your own medical decisions.  [email or web address]  We have ordered labs or studies at this visit. It can take up to 1-2 weeks for results and processing. IF results require follow up or explanation, we will call you with instructions. Clinically stable results will be released to your [facility]. If you have not heard from [place] or cannot find your results in [facility] in 2 weeks please contact our office at [phone].  If you are not yet signed up for [facility], please consider signing up.   We recommend the following healthy lifestyle for LIFE:  1) Eat  a healthy clean diet.  * Tip: Avoid (less then 1 serving per week): processed foods, sweets, sweetened drinks, white starches (rice, flour, bread, potatoes, pasta, etc), red meat, fast foods, butter  *Tip: CHOOSE instead   * 5-9 servings per day of fresh or frozen fruits and vegetables (but not corn, potatoes, bananas, canned or dried fruit)   *nuts and seeds, beans   *olives and olive oil   *small portions of lean meats such as fish and white chicken    *small portions of whole grains  2)Get at least 150 minutes of sweaty aerobic exercise per week.              Colin Benton R., DO

## 2016-03-17 ENCOUNTER — Ambulatory Visit (INDEPENDENT_AMBULATORY_CARE_PROVIDER_SITE_OTHER): Payer: Medicare Other | Admitting: Family Medicine

## 2016-03-17 ENCOUNTER — Encounter: Payer: Self-pay | Admitting: Family Medicine

## 2016-03-17 VITALS — BP 122/72 | HR 90 | Temp 98.2°F | Ht 64.0 in | Wt 131.6 lb

## 2016-03-17 DIAGNOSIS — E785 Hyperlipidemia, unspecified: Secondary | ICD-10-CM

## 2016-03-17 DIAGNOSIS — Z0000 Encounter for general adult medical examination without abnormal findings: Secondary | ICD-10-CM | POA: Diagnosis not present

## 2016-03-17 DIAGNOSIS — I10 Essential (primary) hypertension: Secondary | ICD-10-CM | POA: Diagnosis not present

## 2016-03-17 LAB — LIPID PANEL
CHOLESTEROL: 182 mg/dL (ref 0–200)
HDL: 56.8 mg/dL (ref >39.00)
LDL CALC: 111 mg/dL — AB (ref 0–99)
NonHDL: 125.6
TRIGLYCERIDES: 71 mg/dL (ref 0.0–149.0)
Total CHOL/HDL Ratio: 3
VLDL: 14.2 mg/dL (ref 0.0–40.0)

## 2016-03-17 LAB — BASIC METABOLIC PANEL
BUN: 14 mg/dL (ref 6–23)
CALCIUM: 9.7 mg/dL (ref 8.4–10.5)
CO2: 30 mEq/L (ref 19–32)
Chloride: 101 mEq/L (ref 96–112)
Creatinine, Ser: 0.81 mg/dL (ref 0.40–1.20)
GFR: 88.67 mL/min (ref >60.00)
Glucose, Bld: 86 mg/dL (ref 70–99)
Potassium: 4.7 mEq/L (ref 3.5–5.1)
Sodium: 139 mEq/L (ref 135–145)

## 2016-03-17 LAB — CBC
HCT: 41.3 % (ref 36.0–46.0)
HEMOGLOBIN: 13.8 g/dL (ref 12.0–15.0)
MCHC: 33.5 g/dL (ref 30.0–36.0)
MCV: 90.8 fl (ref 78.0–100.0)
Platelets: 265 10*3/uL (ref 150.0–400.0)
RBC: 4.55 Mil/uL (ref 3.87–5.11)
RDW: 13.6 % (ref 11.5–15.5)
WBC: 8.7 10*3/uL (ref 4.0–10.5)

## 2016-03-17 NOTE — Patient Instructions (Signed)
BEFORE YOU LEAVE: -labs -follow up: 4 months  Vit D3 [phone] IU daily - source naturals drops is a good product according to a recent consumer lab report.  Please see a lawyer and/or go to this website to help you with advanced directives and designating a health care power of attorney so that your wishes will be followed should you become too ill to make your own medical decisions.  [email or web address]  We have ordered labs or studies at this visit. It can take up to 1-2 weeks for results and processing. IF results require follow up or explanation, we will call you with instructions. Clinically stable results will be released to your [facility]. If you have not heard from [place] or cannot find your results in [facility] in 2 weeks please contact our office at [phone].  If you are not yet signed up for [facility], please consider signing up.   We recommend the following healthy lifestyle for LIFE:  1) Eat a healthy clean diet.  * Tip: Avoid (less then 1 serving per week): processed foods, sweets, sweetened drinks, white starches (rice, flour, bread, potatoes, pasta, etc), red meat, fast foods, butter  *Tip: CHOOSE instead   * 5-9 servings per day of fresh or frozen fruits and vegetables (but not corn, potatoes, bananas, canned or dried fruit)   *nuts and seeds, beans   *olives and olive oil   *small portions of lean meats such as fish and white chicken    *small portions of whole grains  2)Get at least 150 minutes of sweaty aerobic exercise per week.

## 2016-05-05 ENCOUNTER — Other Ambulatory Visit: Payer: Self-pay | Admitting: Family Medicine

## 2016-05-16 ENCOUNTER — Encounter: Payer: Self-pay | Admitting: Family Medicine

## 2016-07-14 NOTE — Progress Notes (Signed)
HPI:  Kristina Bridges is a very pleasant 76 y.o. here for follow up. She prefers to avoid most medications, tests and vaccines. Chronic medical problems summarized below were reviewed for changes and stability and were updated as needed below. These issues and their treatment remain stable for the most part. Under increased stress dealing with sick sister and sick daughter (daughter has leukemia). However, she feels she is coping fairly well. Has strong faith in the [place] and is very involved in church and in helping others. Denies CP, SOB, DOE, treatment intolerance or new symptoms.  HTN/Mild HLD: -finally agreed to start medication 02/2014 with Norvasc 5 mg -she exercises and walks daily -no cp, sob, doe, swelling or palpitations  Seasonal allergies: -reports: doing well -denies: symptoms this season  ROS: See pertinent positives and negatives per HPI.  Past Medical History:  Diagnosis Date  . Breast cancer ([place])    remote, tx with surgery and radiation - no recurrence  . Chicken pox   . Hypertension   . Seasonal allergies     Past Surgical History:  Procedure Laterality Date  . BREAST BIOPSY  1992    Family History  Problem Relation Age of Onset  . Hypertension    . Hypertension Mother   . Cancer Sister   . Cancer Brother     throat    Social History   Social History  . Marital status: Divorced    Spouse name: N/A  . Number of children: N/A  . Years of education: N/A   Social History Main Topics  . Smoking status: Never Smoker  . Smokeless tobacco: Never Used  . Alcohol use No  . Drug use: No  . Sexual activity: Not Asked   Other Topics Concern  . None   Social History Narrative   Work or School: retired, used to work in [employer] Situation: lives alone - raised a grandson but he has moved out, he comes to visit, has a lot of good friends - two daughters sylvia and zena with health issues      Spiritual Beliefs: Christian, haas  good support      Lifestyle: exercises every day and eat healthy              Current Outpatient Prescriptions:  .  acetaminophen (TYLENOL) 500 MG tablet, Take 500 mg by mouth as needed., Disp: , Rfl:  .  amLODipine (NORVASC) 5 MG tablet, take 1 tablet by mouth once daily, Disp: 90 tablet, Rfl: 1 .  Multiple Vitamin (MULTIVITAMIN) capsule, Take 1 capsule by mouth daily., Disp: , Rfl:   EXAM:  Vitals:   07/15/16 1037  BP: 126/80  Pulse: 65  Temp: 98.2 F (36.8 C)    Body mass index is 23.34 kg/m.  GENERAL: vitals reviewed and listed above, alert, oriented, appears well hydrated and in no acute distress  HEENT: atraumatic, conjunttiva clear, no obvious abnormalities on inspection of external nose and ears  NECK: no obvious masses on inspection  LUNGS: clear to auscultation bilaterally, no wheezes, rales or rhonchi, good air movement  CV: HRRR, no peripheral edema  MS: moves all extremities without noticeable abnormality  PSYCH: pleasant and cooperative, no obvious depression or anxiety  ASSESSMENT AND PLAN:  Discussed the following assessment and plan:  Essential hypertension  Mild hyperlipidemia  Stress  -counseled and supported -lifestyle recs - she does well with this, exercises and tries to eat healthy, cont norvasc, BP a  little high on arrival but pretty good on recheck -she did want labs today, will plan to do at follow up -Patient advised to return or notify a doctor immediately if symptoms worsen or persist or new concerns arise.  Patient Instructions  BEFORE YOU LEAVE: -follow up: 3-4 months, will plan to do labs then  [facility] in there - will keep you and your family in my thoughts and prayers.  Continue the Norvasc.  We recommend the following healthy lifestyle for LIFE: 1) Small portions.   Tip: eat off of a salad plate instead of a dinner plate.   Tip: if you need more or a snack choose fruits, veggies and/or a handful of nuts or seeds.  2)  Eat a healthy clean diet.  * Tip: Avoid (less then 1 serving per week): processed foods, sweets, sweetened drinks, white starches (rice, flour, bread, potatoes, pasta, etc), red meat, fast foods, butter  *Tip: CHOOSE instead   * 5-9 servings per day of fresh or frozen fruits and vegetables (but not corn, potatoes, bananas, canned or dried fruit)   *nuts and seeds, beans   *olives and olive oil   *small portions of lean meats such as fish and white chicken    *small portions of whole grains  3)Get at least 150 minutes of sweaty aerobic exercise per week.  4)Reduce stress - consider counseling, meditation and relaxation to balance other aspects of your life.     Colin Benton R., DO

## 2016-07-15 ENCOUNTER — Ambulatory Visit (INDEPENDENT_AMBULATORY_CARE_PROVIDER_SITE_OTHER): Payer: Medicare Other | Admitting: Family Medicine

## 2016-07-15 ENCOUNTER — Encounter: Payer: Self-pay | Admitting: Family Medicine

## 2016-07-15 VITALS — BP 126/80 | HR 65 | Temp 98.2°F | Ht 64.0 in | Wt 136.0 lb

## 2016-07-15 DIAGNOSIS — I10 Essential (primary) hypertension: Secondary | ICD-10-CM | POA: Diagnosis not present

## 2016-07-15 DIAGNOSIS — F439 Reaction to severe stress, unspecified: Secondary | ICD-10-CM | POA: Diagnosis not present

## 2016-07-15 DIAGNOSIS — E785 Hyperlipidemia, unspecified: Secondary | ICD-10-CM | POA: Diagnosis not present

## 2016-07-15 DIAGNOSIS — J302 Other seasonal allergic rhinitis: Secondary | ICD-10-CM | POA: Insufficient documentation

## 2016-07-15 NOTE — Progress Notes (Signed)
Pre visit review using our clinic review tool, if applicable. No additional management support is needed unless otherwise documented below in the visit note. 

## 2016-07-15 NOTE — Patient Instructions (Addendum)
BEFORE YOU LEAVE: -follow up: 3-4 months, will plan to do labs then  [facility] in there - will keep you and your family in my thoughts and prayers.  Continue the Norvasc.  We recommend the following healthy lifestyle for LIFE: 1) Small portions.   Tip: eat off of a salad plate instead of a dinner plate.   Tip: if you need more or a snack choose fruits, veggies and/or a handful of nuts or seeds.  2) Eat a healthy clean diet.  * Tip: Avoid (less then 1 serving per week): processed foods, sweets, sweetened drinks, white starches (rice, flour, bread, potatoes, pasta, etc), red meat, fast foods, butter  *Tip: CHOOSE instead   * 5-9 servings per day of fresh or frozen fruits and vegetables (but not corn, potatoes, bananas, canned or dried fruit)   *nuts and seeds, beans   *olives and olive oil   *small portions of lean meats such as fish and white chicken    *small portions of whole grains  3)Get at least 150 minutes of sweaty aerobic exercise per week.  4)Reduce stress - consider counseling, meditation and relaxation to balance other aspects of your life.

## 2016-11-02 ENCOUNTER — Other Ambulatory Visit: Payer: Self-pay | Admitting: Family Medicine

## 2016-11-23 NOTE — Progress Notes (Signed)
HPI:  Acute visit for dysuria: -Started 3 days ago; now reports symptoms actually feel better today -Symptoms include frequency, urgency and dysuria -Denies: Fevers, flank pain, nausea, vomiting, vaginal symptoms, malaise, hematuria  ROS: See pertinent positives and negatives per HPI.  Past Medical History:  Diagnosis Date  . Breast cancer ([place])    remote, tx with surgery and radiation - no recurrence  . Chicken pox   . Hypertension   . Seasonal allergies     Past Surgical History:  Procedure Laterality Date  . BREAST BIOPSY  1992    Family History  Problem Relation Age of Onset  . Hypertension Unknown   . Hypertension Mother   . Cancer Sister   . Cancer Brother        throat    Social History   Social History  . Marital status: Divorced    Spouse name: N/A  . Number of children: N/A  . Years of education: N/A   Social History Main Topics  . Smoking status: Never Smoker  . Smokeless tobacco: Never Used  . Alcohol use No  . Drug use: No  . Sexual activity: Not Asked   Other Topics Concern  . None   Social History Narrative   Work or School: retired, used to work in [employer] Situation: lives alone - raised a grandson but he has moved out, he comes to visit, has a lot of good friends - two daughters sylvia and zena with health issues      Spiritual Beliefs: Christian, haas good support      Lifestyle: exercises every day and eat healthy              Current Outpatient Prescriptions:  .  acetaminophen (TYLENOL) 500 MG tablet, Take 500 mg by mouth as needed., Disp: , Rfl:  .  amLODipine (NORVASC) 5 MG tablet, take 1 tablet by mouth daily, Disp: 90 tablet, Rfl: 0 .  Multiple Vitamin (MULTIVITAMIN) capsule, Take 1 capsule by mouth daily., Disp: , Rfl:  .  nitrofurantoin, macrocrystal-monohydrate, (MACROBID) 100 MG capsule, Take 1 capsule (100 mg total) by mouth 2 (two) times daily., Disp: 14 capsule, Rfl: 0  EXAM:  Vitals:   11/24/16 1509  BP: 132/74  Pulse: 73  Temp: 98.5 F (36.9 C)    Body mass index is 23.38 kg/m.  GENERAL: vitals reviewed and listed above, alert, oriented, appears well hydrated and in no acute distress  HEENT: atraumatic, conjunttiva clear, no obvious abnormalities on inspection of external nose and ears  NECK: no obvious masses on inspection  LUNGS: clear to auscultation bilaterally, no wheezes, rales or rhonchi, good air movement  CV: HRRR, no peripheral edema  ABD: Bowel sounds positive in all 4 quadrants, nontender to palpation, no CVA tenderness to palpation  MS: moves all extremities without noticeable abnormality  PSYCH: pleasant and cooperative, no obvious depression or anxiety  ASSESSMENT AND PLAN:  Discussed the following assessment and plan:  Dysuria - Plan: POCT Urinalysis Dipstick (Automated), Culture, Urine  -we discussed possible serious and likely etiologies, workup and treatment, treatment risks and return precautions - urine dip was symptoms this consistent with a UTI -after this discussion, Kristina Bridges opted for empiric treatment with Macrobid and, culture pending -follow up advised for physical when due and as needed in the interim -of course, we advised Kristina Bridges  to return or notify a doctor immediately if symptoms worsen or persist or new concerns arise.  Patient Instructions  BEFORE YOU LEAVE: -please start the antibiotic -follow up: Annual wellness visit with Kristina Bridges and follow-up with Kristina Bridges on the same day in December or January (after December 7)  I am glad you're feeling better. Please follow up if you have any further symptoms.   Colin Benton R., DO

## 2016-11-24 ENCOUNTER — Encounter: Payer: Self-pay | Admitting: Family Medicine

## 2016-11-24 ENCOUNTER — Ambulatory Visit (INDEPENDENT_AMBULATORY_CARE_PROVIDER_SITE_OTHER): Payer: Medicare Other | Admitting: Family Medicine

## 2016-11-24 VITALS — BP 132/74 | HR 73 | Temp 98.5°F | Ht 64.0 in | Wt 136.2 lb

## 2016-11-24 DIAGNOSIS — R300 Dysuria: Secondary | ICD-10-CM

## 2016-11-24 LAB — POC URINALSYSI DIPSTICK (AUTOMATED)
Bilirubin, UA: NEGATIVE
Blood, UA: 10
GLUCOSE UA: NEGATIVE
KETONES UA: NEGATIVE
Nitrite, UA: NEGATIVE
Protein, UA: NEGATIVE
SPEC GRAV UA: 1.015 (ref 1.010–1.025)
Urobilinogen, UA: 0.2 E.U./dL
pH, UA: 6 (ref 5.0–8.0)

## 2016-11-24 MED ORDER — NITROFURANTOIN MONOHYD MACRO 100 MG PO CAPS: 100.0000 mg | ORAL_CAPSULE | Freq: Two times a day (BID) | ORAL | 0 refills | Status: DC

## 2016-11-24 NOTE — Patient Instructions (Addendum)
BEFORE YOU LEAVE: -please start the antibiotic -follow up: Annual wellness visit with Manuela Schwartz and follow-up with Dr. Maudie Mercury on the same day in December or January (after December 7)  I am glad you're feeling better. Please follow up if you have any further symptoms.

## 2016-11-25 LAB — URINE CULTURE

## 2017-01-24 ENCOUNTER — Encounter: Payer: Self-pay | Admitting: Family Medicine

## 2017-01-31 ENCOUNTER — Other Ambulatory Visit: Payer: Self-pay | Admitting: Family Medicine

## 2017-04-18 ENCOUNTER — Ambulatory Visit: Payer: Medicare Other

## 2017-04-18 ENCOUNTER — Ambulatory Visit: Payer: Medicare Other | Admitting: Family Medicine

## 2017-04-18 NOTE — Progress Notes (Deleted)
Subjective:   Kristina Bridges is a 77 y.o. female who presents for Medicare Annual (Subsequent) preventive examination.    Diet Chol/hdl ratio is 3; checked 03/2016  Exercise   There are no preventive care reminders to display for this patient. No immunizations listed   Mammogram 09/2002 Dexa;    Qualifies for Shingles Vaccine?***    Objective:     Vitals: There were no vitals taken for this visit.  There is no height or weight on file to calculate BMI.  Advanced Directives 03/03/2016  Does Patient Have a Medical Advance Directive? No    Tobacco Social History   Tobacco Use  Smoking Status Never Smoker  Smokeless Tobacco Never Used     Counseling given: Not Answered   Clinical Intake:    Past Medical History:  Diagnosis Date  . Breast cancer ([place])    remote, tx with surgery and radiation - no recurrence  . Chicken pox   . Hypertension   . Seasonal allergies    Past Surgical History:  Procedure Laterality Date  . BREAST BIOPSY  1992   Family History  Problem Relation Age of Onset  . Hypertension Unknown   . Hypertension Mother   . Cancer Sister   . Cancer Brother        throat   Social History   Socioeconomic History  . Marital status: Divorced    Spouse name: Not on file  . Number of children: Not on file  . Years of education: Not on file  . Highest education level: Not on file  Social Needs  . Financial resource strain: Not on file  . Food insecurity - worry: Not on file  . Food insecurity - inability: Not on file  . Transportation needs - medical: Not on file  . Transportation needs - non-medical: Not on file  Occupational History  . Not on file  Tobacco Use  . Smoking status: Never Smoker  . Smokeless tobacco: Never Used  Substance and Sexual Activity  . Alcohol use: No  . Drug use: No  . Sexual activity: Not on file  Other Topics Concern  . Not on file  Social History Narrative   Work or School: retired, used to  work in [employer] Situation: lives alone - raised a grandson but he has moved out, he comes to visit, has a lot of good friends - two daughters sylvia and zena with health issues      Spiritual Beliefs: Darrick Meigs, haas good support      Lifestyle: exercises every day and eat healthy          Outpatient Encounter Medications as of 04/18/2017  Medication Sig  . acetaminophen (TYLENOL) 500 MG tablet Take 500 mg by mouth as needed.  Marland Kitchen amLODipine (NORVASC) 5 MG tablet take 1 tablet by mouth daily  . Multiple Vitamin (MULTIVITAMIN) capsule Take 1 capsule by mouth daily.  . nitrofurantoin, macrocrystal-monohydrate, (MACROBID) 100 MG capsule Take 1 capsule (100 mg total) by mouth 2 (two) times daily.   No facility-administered encounter medications on file as of 04/18/2017.     Activities of Daily Living No flowsheet data found.  Patient Care Team: Lucretia Kern, DO as PCP - General (Family Medicine)    Assessment:   This is a routine wellness examination for Kristina Bridges.  Exercise Activities and Dietary recommendations    Goals    None      Fall Risk Fall Risk  03/17/2016 03/21/2014  Falls in the past year? No No   Is the patient's home free of loose throw rugs in walkways, pet beds, electrical cords, etc?   {Blank single:19197::"yes","no"}      Grab bars in the bathroom? {Blank single:19197::"yes","no"}      Handrails on the stairs?   {Blank single:19197::"yes","no"}      Adequate lighting?   {Blank single:19197::"yes","no"}  Timed Get Up and Go performed: ***  Depression Screen PHQ 2/9 Scores 03/17/2016 03/21/2014  PHQ - 2 Score 0 0     Cognitive Function         There is no immunization history on file for this patient.    Screening Tests Health Maintenance  Topic Date Due  . DEXA SCAN  09/18/2019 (Originally 04/25/2006)  . MAMMOGRAM  03/17/2021 (Originally 10/08/2003)  . TETANUS/TDAP  09/17/2024 (Originally 09/10/2014)  . PNA vac Low Risk Adult  (1 of 2 - PCV13) 09/17/2024 (Originally 04/25/2006)  . INFLUENZA VACCINE  03/11/2025 (Originally 11/09/2016)  . COLON CANCER SCREENING ANNUAL FOBT  01/24/2018         Plan:      PCP Notes ***  Health Maintenance ***  Abnormal Screens  ***  Referrals  ***  Patient concerns; ***  Nurse Concerns; ***  Next PCP apt ***      I have personally reviewed and noted the following in the patient's chart:   . Medical and social history . Use of alcohol, tobacco or illicit drugs  . Current medications and supplements . Functional ability and status . Nutritional status . Physical activity . Advanced directives . List of other physicians . Hospitalizations, surgeries, and ER visits in previous 12 months . Vitals . Screenings to include cognitive, depression, and falls . Referrals and appointments  In addition, I have reviewed and discussed with patient certain preventive protocols, quality metrics, and best practice recommendations. A written personalized care plan for preventive services as well as general preventive health recommendations were provided to patient.     Wynetta Fines, RN  04/18/2017

## 2017-05-22 NOTE — Progress Notes (Signed)
HPI:  Kristina Bridges is a pleasant 77 y.o. here for follow up. Chronic medical problems summarized below were reviewed for changes and stability.  She prefers to avoid medications, test and vaccines as much as she possibly can and refuses most.  Reports she is doing great.  He gets regular exercise every day and tries to eat a healthy diet.  Her blood pressure medication.  Denies CP, SOB, DOE, treatment intolerance or new symptoms. Due for annual wellness visit -seeing Manuela Schwartz today after her visit with me.    HTN/Mild HLD: -finally agreed to start medication 02/2014 with Norvasc 5 mg -she exercises and walks daily -no cp, sob, doe, swelling or palpitations  Seasonal allergies: -reports: doing well -denies: symptoms this season  ROS: See pertinent positives and negatives per HPI.  Past Medical History:  Diagnosis Date  . Breast cancer ([place])    remote, tx with surgery and radiation - no recurrence  . Chicken pox   . Hypertension   . Seasonal allergies     Past Surgical History:  Procedure Laterality Date  . BREAST BIOPSY  1992    Family History  Problem Relation Age of Onset  . Hypertension Unknown   . Hypertension Mother   . Cancer Sister   . Cancer Brother        throat    Social History   Socioeconomic History  . Marital status: Divorced    Spouse name: None  . Number of children: None  . Years of education: None  . Highest education level: None  Social Needs  . Financial resource strain: None  . Food insecurity - worry: None  . Food insecurity - inability: None  . Transportation needs - medical: None  . Transportation needs - non-medical: None  Occupational History  . None  Tobacco Use  . Smoking status: Never Smoker  . Smokeless tobacco: Never Used  Substance and Sexual Activity  . Alcohol use: No  . Drug use: No  . Sexual activity: None  Other Topics Concern  . None  Social History Narrative   Work or School: retired, used to work in  [employer] Situation: lives alone - raised a grandson but he has moved out, he comes to visit, has a lot of good friends - two daughters sylvia and zena with health issues      Spiritual Beliefs: Christian, haas good support      Lifestyle: exercises every day and eat healthy           Current Outpatient Medications:  .  acetaminophen (TYLENOL) 500 MG tablet, Take 500 mg by mouth as needed., Disp: , Rfl:  .  amLODipine (NORVASC) 5 MG tablet, take 1 tablet by mouth daily, Disp: 90 tablet, Rfl: 2 .  Multiple Vitamin (MULTIVITAMIN) capsule, Take 1 capsule by mouth daily., Disp: , Rfl:   EXAM:  Vitals:   05/23/17 1055  BP: 120/70  Pulse: 75  Temp: 98.5 F (36.9 C)    Body mass index is 23.34 kg/m.  GENERAL: vitals reviewed and listed above, alert, oriented, appears well hydrated and in no acute distress  HEENT: atraumatic, conjunttiva clear, no obvious abnormalities on inspection of external nose and ears  NECK: no obvious masses on inspection  LUNGS: clear to auscultation bilaterally, no wheezes, rales or rhonchi, good air movement  CV: HRRR, no peripheral edema  MS: moves all extremities without noticeable abnormality  PSYCH: pleasant and cooperative, no obvious depression or  anxiety  ASSESSMENT AND PLAN:  Discussed the following assessment and plan:  Essential hypertension - Plan: Basic metabolic panel, CBC  Mild hyperlipidemia - Plan: Lipid panel  -Congratulated her on healthy lifestyle and discussed goals healthy diet and regular exercise -Labs today after her wellness visit with Manuela Schwartz -Follow-up in about 4-6 months -Patient advised to return or notify a doctor immediately if symptoms worsen or persist or new concerns arise.  Patient Instructions  BEFORE YOU LEAVE: -labs -follow up: 4-5 months  We have ordered labs or studies at this visit. It can take up to 1-2 weeks for results and processing. IF results require follow up or explanation,  we will call you with instructions. Clinically stable results will be released to your [facility]. If you have not heard from [place] or cannot find your results in [facility] in 2 weeks please contact our office at [phone].  If you are not yet signed up for [facility], please consider signing up.           Lucretia Kern, DO

## 2017-05-23 ENCOUNTER — Ambulatory Visit (INDEPENDENT_AMBULATORY_CARE_PROVIDER_SITE_OTHER): Payer: Medicare Other | Admitting: Family Medicine

## 2017-05-23 ENCOUNTER — Encounter: Payer: Self-pay | Admitting: Family Medicine

## 2017-05-23 ENCOUNTER — Ambulatory Visit: Payer: Medicare Other

## 2017-05-23 VITALS — BP 120/70 | Ht 64.0 in | Wt 136.0 lb

## 2017-05-23 VITALS — BP 120/70 | HR 75 | Temp 98.5°F | Ht 64.0 in | Wt 136.0 lb

## 2017-05-23 DIAGNOSIS — E785 Hyperlipidemia, unspecified: Secondary | ICD-10-CM

## 2017-05-23 DIAGNOSIS — Z0000 Encounter for general adult medical examination without abnormal findings: Secondary | ICD-10-CM | POA: Diagnosis not present

## 2017-05-23 DIAGNOSIS — I10 Essential (primary) hypertension: Secondary | ICD-10-CM | POA: Diagnosis not present

## 2017-05-23 LAB — BASIC METABOLIC PANEL
BUN: 16 mg/dL (ref 6–23)
CHLORIDE: 102 meq/L (ref 96–112)
CO2: 30 mEq/L (ref 19–32)
Calcium: 9.7 mg/dL (ref 8.4–10.5)
Creatinine, Ser: 0.71 mg/dL (ref 0.40–1.20)
GFR: 102.91 mL/min (ref >60.00)
GLUCOSE: 94 mg/dL (ref 70–99)
POTASSIUM: 4.5 meq/L (ref 3.5–5.1)
Sodium: 139 mEq/L (ref 135–145)

## 2017-05-23 LAB — LIPID PANEL
CHOL/HDL RATIO: 4
Cholesterol: 222 mg/dL — ABNORMAL HIGH (ref 0–200)
HDL: 60.6 mg/dL (ref >39.00)
LDL CALC: 150 mg/dL — AB (ref 0–99)
NonHDL: 161.57
TRIGLYCERIDES: 60 mg/dL (ref 0.0–149.0)
VLDL: 12 mg/dL (ref 0.0–40.0)

## 2017-05-23 LAB — CBC
HCT: 42.2 % (ref 36.0–46.0)
HEMOGLOBIN: 14.1 g/dL (ref 12.0–15.0)
MCHC: 33.5 g/dL (ref 30.0–36.0)
MCV: 91.6 fl (ref 78.0–100.0)
Platelets: 314 10*3/uL (ref 150.0–400.0)
RBC: 4.61 Mil/uL (ref 3.87–5.11)
RDW: 13.2 % (ref 11.5–15.5)
WBC: 6.6 10*3/uL (ref 4.0–10.5)

## 2017-05-23 NOTE — Progress Notes (Signed)
Subjective:   Kristina Bridges is a 77 y.o. female who presents for Medicare Annual (Subsequent) preventive examination.  Reports health as Seeing Dr. 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  Mammogram - declined;  Had cancer a long time ago,  Checked her own breast and found an area of concern  In her left breast after she had been examined. Continues to do SBE   Dexa Declined  Pneumonia series declined Flu vaccine declined  Educated on shingrix but it was declined as well   Cardiac Risk Factors include: advanced age (>60men, >4 women)     Objective:     Vitals: BP 120/70   Ht 5\' 4"  (1.626 m)   Wt 136 lb (61.7 kg)   BMI 23.34 kg/m   Body mass index is 23.34 kg/m.  Advanced Directives 05/23/2017 03/03/2016  Does Patient Have a Medical Advance Directive? No No    Tobacco Social History   Tobacco Use  Smoking Status Never Smoker  Smokeless Tobacco Never Used     Counseling given: Yes   Clinical Intake:   Past Medical History:  Diagnosis Date  . Breast cancer ([place])    remote, tx with surgery and radiation - no recurrence  . Chicken pox   . Hypertension   . Seasonal allergies    Past Surgical History:  Procedure Laterality Date  . BREAST BIOPSY  1992   Family History  Problem Relation Age of Onset  . Hypertension Unknown   . Hypertension Mother   . Cancer Sister   . Cancer Brother        throat   Social History   Socioeconomic History  . Marital status: Divorced    Spouse name: Not on file  . Number of children: Not on file  .  Years of education: Not on file  . Highest education level: Not on file  Social Needs  . Financial resource strain: Not on file  . Food insecurity - worry: Not on file  . Food insecurity - inability: Not on file  . Transportation needs - medical: Not on file  . Transportation needs - non-medical: Not on file  Occupational History  . Not on file  Tobacco Use  . Smoking status: Never Smoker  . Smokeless tobacco: Never Used  Substance and Sexual Activity  . Alcohol use: No  . Drug use: No  . Sexual activity: Not on file  Other Topics Concern  . Not on file  Social History Narrative   Work or School: retired, used to work in [employer] Situation: lives alone - raised a grandson but he has moved out, he comes to visit, has a lot of good friends - two daughters sylvia and zena with health issues      Spiritual Beliefs: Darrick Meigs, haas good support      Lifestyle: exercises every day and eat healthy          Outpatient Encounter Medications as of 05/23/2017  Medication  Sig  . acetaminophen (TYLENOL) 500 MG tablet Take 500 mg by mouth as needed.  Marland Kitchen amLODipine (NORVASC) 5 MG tablet take 1 tablet by mouth daily  . Multiple Vitamin (MULTIVITAMIN) capsule Take 1 capsule by mouth daily.  . [DISCONTINUED] nitrofurantoin, macrocrystal-monohydrate, (MACROBID) 100 MG capsule Take 1 capsule (100 mg total) by mouth 2 (two) times daily.   No facility-administered encounter medications on file as of 05/23/2017.     Activities of Daily Living In your present state of health, do you have any difficulty performing the following activities: 05/23/2017  Hearing? N  Vision? N  Difficulty concentrating or making decisions? N  Walking or climbing stairs? N  Dressing or bathing? N  Doing errands, shopping? N  Preparing Food and eating ? N  Using the Toilet? N  In the past six months, have you accidently leaked urine? N  Do you have problems with loss of bowel control? N  Managing  your Medications? N  Managing your Finances? N  Housekeeping or managing your Housekeeping? N  Some recent data might be hidden    Patient Care Team: Lucretia Kern, DO as PCP - General (Family Medicine)    Assessment:   This is a routine wellness examination for [name].  Exercise Activities and Dietary recommendations Current Exercise Habits: Home exercise routine, Type of exercise: strength training/weights;stretching;walking, Time (Minutes): 60, Frequency (Times/Week): 5, Weekly Exercise (Minutes/Week): 300, Intensity: Moderate  Goals    . Patient Stated     Can maintain your current health  Continue to exercise  Continue to enjoy your life        Fall Risk Fall Risk  05/23/2017 05/23/2017 03/17/2016 03/21/2014  Falls in the past year? No No No No     Depression Screen PHQ 2/9 Scores 05/23/2017 05/23/2017 03/17/2016 03/21/2014  PHQ - 2 Score 0 0 0 0     Cognitive Function         There is no immunization history on file for this patient.  Qualifies for Shingles Vaccine?  Screening Tests Health Maintenance  Topic Date Due  . DEXA SCAN  09/18/2019 (Originally 04/25/2006)  . MAMMOGRAM  03/17/2021 (Originally 10/08/2003)  . TETANUS/TDAP  09/17/2024 (Originally 09/10/2014)  . PNA vac Low Risk Adult (1 of 2 - PCV13) 09/17/2024 (Originally 04/25/2006)  . INFLUENZA VACCINE  03/11/2025 (Originally 11/09/2016)          Plan:      PCP Notes   Health Maintenance Mammogram - declined;  Had cancer a long time ago,  Checked her own breast and found an area of concern  In her left breast after she had been examined but prior physician  Continues to do SBE   Dexa Declined  Pneumonia series declined Flu vaccine declined  Educated on shingrix but it was declined as well   Abnormal Screens  none  Referrals  none  Patient concerns; None verbalized today  Nurse Concerns; As noted  Very active; has vigorous home exercise routine  Next PCP apt As noted       I have personally reviewed and noted the following in the patient's chart:   . Medical and social history . Use of alcohol, tobacco or illicit drugs  . Current medications and supplements . Functional ability and status . Nutritional status . Physical activity . Advanced directives . List of other physicians . Hospitalizations, surgeries, and ER visits in previous 12 months . Vitals . Screenings to include cognitive, depression, and falls . Referrals and appointments  In addition, I have reviewed and discussed with patient certain preventive protocols, quality metrics, and best practice recommendations. A written personalized care plan for preventive services as well as general preventive health recommendations were provided to patient.     Wynetta Fines, RN  05/23/2017

## 2017-05-23 NOTE — Progress Notes (Signed)
Kristina R Kim, DO  

## 2017-05-23 NOTE — Patient Instructions (Addendum)
[name] , Thank you for taking time to come for your Medicare Wellness Visit. I appreciate your ongoing commitment to your health goals. Please review the following plan we discussed and let me know if I can assist you in the future.   Will try to schedule an eye exam this year   Shingrix is a vaccine for the prevention of Shingles in Adults 50 and older.  If you are on Medicare, you can request a prescription from your doctor to be filled at a pharmacy.  Please check with your benefits regarding applicable copays or out of pocket expenses.  The Shingrix is given in 2 vaccines approx 8 weeks apart. You must receive the 2nd dose prior to 6 months from receipt of the first.   These are the goals we discussed: Goals    . Patient Stated     Can maintain your current health  Continue to exercise  Continue to enjoy your life        This is a list of the screening recommended for you and due dates:  Health Maintenance  Topic Date Due  . DEXA scan (bone density measurement)  09/18/2019*  . Mammogram  03/17/2021*  . Tetanus Vaccine  09/17/2024*  . Pneumonia vaccines (1 of 2 - PCV13) 09/17/2024*  . Flu Shot  03/11/2025*  *Topic was postponed. The date shown is not the original due date.    Health Maintenance, Female Adopting a healthy lifestyle and getting preventive care can go a long way to promote health and wellness. Talk with your health care provider about what schedule of regular examinations is right for you. This is a good chance for you to check in with your provider about disease prevention and staying healthy. In between checkups, there are plenty of things you can do on your own. Experts have done a lot of research about which lifestyle changes and preventive measures are most likely to keep you healthy. Ask your health care provider for more information. Weight and diet Eat a healthy diet  Be sure to include plenty of vegetables, fruits, low-fat dairy products, and lean  protein.  Do not eat a lot of foods high in solid fats, added sugars, or salt.  Get regular exercise. This is one of the most important things you can do for your health. ? Most adults should exercise for at least 150 minutes each week. The exercise should increase your heart rate and make you sweat (moderate-intensity exercise). ? Most adults should also do strengthening exercises at least twice a week. This is in addition to the moderate-intensity exercise.  Maintain a healthy weight  Body mass index (BMI) is a measurement that can be used to identify possible weight problems. It estimates body fat based on height and weight. Your health care provider can help determine your BMI and help you achieve or maintain a healthy weight.  For females 29 years of age and older: ? A BMI below 18.5 is considered underweight. ? A BMI of 18.5 to 24.9 is normal. ? A BMI of 25 to 29.9 is considered overweight. ? A BMI of 30 and above is considered obese.  Watch levels of cholesterol and blood lipids  You should start having your blood tested for lipids and cholesterol at 76 years of age, then have this test every 5 years.  You may need to have your cholesterol levels checked more often if: ? Your lipid or cholesterol levels are high. ? You are older than 76  years of age. ? You are at high risk for heart disease.  Cancer screening Lung Cancer  Lung cancer screening is recommended for adults 60-37 years old who are at high risk for lung cancer because of a history of smoking.  A yearly low-dose CT scan of the lungs is recommended for people who: ? Currently smoke. ? Have quit within the past 15 years. ? Have at least a 30-pack-year history of smoking. A pack year is smoking an average of one pack of cigarettes a day for 1 year.  Yearly screening should continue until it has been 15 years since you quit.  Yearly screening should stop if you develop a health problem that would prevent you from  having lung cancer treatment.  Breast Cancer  Practice breast self-awareness. This means understanding how your breasts normally appear and feel.  It also means doing regular breast self-exams. Let your health care provider know about any changes, no matter how small.  If you are in your 20s or 30s, you should have a clinical breast exam (CBE) by a health care provider every 1-3 years as part of a regular health exam.  If you are 70 or older, have a CBE every year. Also consider having a breast X-ray (mammogram) every year.  If you have a family history of breast cancer, talk to your health care provider about genetic screening.  If you are at high risk for breast cancer, talk to your health care provider about having an MRI and a mammogram every year.  Breast cancer gene (BRCA) assessment is recommended for women who have family members with BRCA-related cancers. BRCA-related cancers include: ? Breast. ? Ovarian. ? Tubal. ? Peritoneal cancers.  Results of the assessment will determine the need for genetic counseling and BRCA1 and BRCA2 testing.  Cervical Cancer Your health care provider may recommend that you be screened regularly for cancer of the pelvic organs (ovaries, uterus, and vagina). This screening involves a pelvic examination, including checking for microscopic changes to the surface of your cervix (Pap test). You may be encouraged to have this screening done every 3 years, beginning at age 51.  For women ages 15-65, health care providers may recommend pelvic exams and Pap testing every 3 years, or they may recommend the Pap and pelvic exam, combined with testing for human papilloma virus (HPV), every 5 years. Some types of HPV increase your risk of cervical cancer. Testing for HPV may also be done on women of any age with unclear Pap test results.  Other health care providers may not recommend any screening for nonpregnant women who are considered low risk for pelvic cancer  and who do not have symptoms. Ask your health care provider if a screening pelvic exam is right for you.  If you have had past treatment for cervical cancer or a condition that could lead to cancer, you need Pap tests and screening for cancer for at least 20 years after your treatment. If Pap tests have been discontinued, your risk factors (such as having a new sexual partner) need to be reassessed to determine if screening should resume. Some women have medical problems that increase the chance of getting cervical cancer. In these cases, your health care provider may recommend more frequent screening and Pap tests.  Colorectal Cancer  This type of cancer can be detected and often prevented.  Routine colorectal cancer screening usually begins at 76 years of age and continues through 76 years of age.  Your health care  provider may recommend screening at an earlier age if you have risk factors for colon cancer.  Your health care provider may also recommend using home test kits to check for hidden blood in the stool.  A small camera at the end of a tube can be used to examine your colon directly (sigmoidoscopy or colonoscopy). This is done to check for the earliest forms of colorectal cancer.  Routine screening usually begins at age 59.  Direct examination of the colon should be repeated every 5-10 years through 76 years of age. However, you may need to be screened more often if early forms of precancerous polyps or small growths are found.  Skin Cancer  Check your skin from head to toe regularly.  Tell your health care provider about any new moles or changes in moles, especially if there is a change in a mole's shape or color.  Also tell your health care provider if you have a mole that is larger than the size of a pencil eraser.  Always use sunscreen. Apply sunscreen liberally and repeatedly throughout the day.  Protect yourself by wearing long sleeves, pants, a wide-brimmed hat, and  sunglasses whenever you are outside.  Heart disease, diabetes, and high blood pressure  High blood pressure causes heart disease and increases the risk of stroke. High blood pressure is more likely to develop in: ? People who have blood pressure in the high end of the normal range (130-139/85-89 mm Hg). ? People who are overweight or obese. ? People who are African American.  If you are 16-64 years of age, have your blood pressure checked every 3-5 years. If you are 82 years of age or older, have your blood pressure checked every year. You should have your blood pressure measured twice-once when you are at a hospital or clinic, and once when you are not at a hospital or clinic. Record the average of the two measurements. To check your blood pressure when you are not at a hospital or clinic, you can use: ? An automated blood pressure machine at a pharmacy. ? A home blood pressure monitor.  If you are between 68 years and 40 years old, ask your health care provider if you should take aspirin to prevent strokes.  Have regular diabetes screenings. This involves taking a blood sample to check your fasting blood sugar level. ? If you are at a normal weight and have a low risk for diabetes, have this test once every three years after 76 years of age. ? If you are overweight and have a high risk for diabetes, consider being tested at a younger age or more often. Preventing infection Hepatitis B  If you have a higher risk for hepatitis B, you should be screened for this virus. You are considered at high risk for hepatitis B if: ? You were born in a country where hepatitis B is common. Ask your health care provider which countries are considered high risk. ? Your parents were born in a high-risk country, and you have not been immunized against hepatitis B (hepatitis B vaccine). ? You have HIV or AIDS. ? You use needles to inject street drugs. ? You live with someone who has hepatitis B. ? You have  had sex with someone who has hepatitis B. ? You get hemodialysis treatment. ? You take certain medicines for conditions, including cancer, organ transplantation, and autoimmune conditions.  Hepatitis C  Blood testing is recommended for: ? Everyone born from [birth year] through 1964. ? Anyone  with known risk factors for hepatitis C.  Sexually transmitted infections (STIs)  You should be screened for sexually transmitted infections (STIs) including gonorrhea and chlamydia if: ? You are sexually active and are younger than 76 years of age. ? You are older than 76 years of age and your health care provider tells you that you are at risk for this type of infection. ? Your sexual activity has changed since you were last screened and you are at an increased risk for chlamydia or gonorrhea. Ask your health care provider if you are at risk.  If you do not have HIV, but are at risk, it may be recommended that you take a prescription medicine daily to prevent HIV infection. This is called pre-exposure prophylaxis (PrEP). You are considered at risk if: ? You are sexually active and do not regularly use condoms or know the HIV status of your partner(s). ? You take drugs by injection. ? You are sexually active with a partner who has HIV.  Talk with your health care provider about whether you are at high risk of being infected with HIV. If you choose to begin PrEP, you should first be tested for HIV. You should then be tested every 3 months for as long as you are taking PrEP. Pregnancy  If you are premenopausal and you may become pregnant, ask your health care provider about preconception counseling.  If you may become pregnant, take 400 to 800 micrograms (mcg) of folic acid every day.  If you want to prevent pregnancy, talk to your health care provider about birth control (contraception). Osteoporosis and menopause  Osteoporosis is a disease in which the bones lose minerals and strength with aging. This  can result in serious bone fractures. Your risk for osteoporosis can be identified using a bone density scan.  If you are 74 years of age or older, or if you are at risk for osteoporosis and fractures, ask your health care provider if you should be screened.  Ask your health care provider whether you should take a calcium or vitamin D supplement to lower your risk for osteoporosis.  Menopause may have certain physical symptoms and risks.  Hormone replacement therapy may reduce some of these symptoms and risks. Talk to your health care provider about whether hormone replacement therapy is right for you. Follow these instructions at home:  Schedule regular health, dental, and eye exams.  Stay current with your immunizations.  Do not use any tobacco products including cigarettes, chewing tobacco, or electronic cigarettes.  If you are pregnant, do not drink alcohol.  If you are breastfeeding, limit how much and how often you drink alcohol.  Limit alcohol intake to no more than 1 drink per day for nonpregnant women. One drink equals 12 ounces of beer, 5 ounces of wine, or 1 ounces of hard liquor.  Do not use street drugs.  Do not share needles.  Ask your health care provider for help if you need support or information about quitting drugs.  Tell your health care provider if you often feel depressed.  Tell your health care provider if you have ever been abused or do not feel safe at home. This information is not intended to replace advice given to you by your health care provider. Make sure you discuss any questions you have with your health care provider. Document Released: 10/11/2010 Document Revised: 09/03/2015 Document Reviewed: 12/30/2014 Elsevier Interactive Patient Education  2018 [place] in the Home Falls can cause  injuries and can affect people from all age groups. There are many simple things that you can do to make your home safe and to help prevent  falls. What can I do on the outside of my home?  Regularly repair the edges of walkways and driveways and fix any cracks.  Remove high doorway thresholds.  Trim any shrubbery on the main path into your home.  Use bright outdoor lighting.  Clear walkways of debris and clutter, including tools and rocks.  Regularly check that handrails are securely fastened and in good repair. Both sides of any steps should have handrails.  Install guardrails along the edges of any raised decks or porches.  Have leaves, snow, and ice cleared regularly.  Use sand or salt on walkways during winter months.  In the garage, clean up any spills right away, including grease or oil spills. What can I do in the bathroom?  Use night lights.  Install grab bars by the toilet and in the tub and shower. Do not use towel bars as grab bars.  Use non-skid mats or decals on the floor of the tub or shower.  If you need to sit down while you are in the shower, use a plastic, non-slip stool.  Keep the floor dry. Immediately clean up any water that spills on the floor.  Remove soap buildup in the tub or shower on a regular basis.  Attach bath mats securely with double-sided non-slip rug tape.  Remove throw rugs and other tripping hazards from the floor. What can I do in the bedroom?  Use night lights.  Make sure that a bedside light is easy to reach.  Do not use oversized bedding that drapes onto the floor.  Have a firm chair that has side arms to use for getting dressed.  Remove throw rugs and other tripping hazards from the floor. What can I do in the kitchen?  Clean up any spills right away.  Avoid walking on wet floors.  Place frequently used items in easy-to-reach places.  If you need to reach for something above you, use a sturdy step stool that has a grab bar.  Keep electrical cables out of the way.  Do not use floor polish or wax that makes floors slippery. If you have to use wax, make  sure that it is non-skid floor wax.  Remove throw rugs and other tripping hazards from the floor. What can I do in the stairways?  Do not leave any items on the stairs.  Make sure that there are handrails on both sides of the stairs. Fix handrails that are broken or loose. Make sure that handrails are as long as the stairways.  Check any carpeting to make sure that it is firmly attached to the stairs. Fix any carpet that is loose or worn.  Avoid having throw rugs at the top or bottom of stairways, or secure the rugs with carpet tape to prevent them from moving.  Make sure that you have a light switch at the top of the stairs and the bottom of the stairs. If you do not have them, have them installed. What are some other fall prevention tips?  Wear closed-toe shoes that fit well and support your feet. Wear shoes that have rubber soles or low heels.  When you use a stepladder, make sure that it is completely opened and that the sides are firmly locked. Have someone hold the ladder while you are using it. Do not climb a closed stepladder.  Add color or contrast paint or tape to grab bars and handrails in your home. Place contrasting color strips on the first and last steps.  Use mobility aids as needed, such as canes, walkers, scooters, and crutches.  Turn on lights if it is dark. Replace any light bulbs that burn out.  Set up furniture so that there are clear paths. Keep the furniture in the same spot.  Fix any uneven floor surfaces.  Choose a carpet design that does not hide the edge of steps of a stairway.  Be aware of any and all pets.  Review your medicines with your healthcare provider. Some medicines can cause dizziness or changes in blood pressure, which increase your risk of falling. Talk with your health care provider about other ways that you can decrease your risk of falls. This may include working with a physical therapist or trainer to improve your strength, balance, and  endurance. This information is not intended to replace advice given to you by your health care provider. Make sure you discuss any questions you have with your health care provider. Document Released: 03/18/2002 Document Revised: 08/25/2015 Document Reviewed: 05/02/2014 Elsevier Interactive Patient Education  [employer].

## 2017-05-23 NOTE — Patient Instructions (Signed)
BEFORE YOU LEAVE: -labs -follow up: 4-5 months  We have ordered labs or studies at this visit. It can take up to 1-2 weeks for results and processing. IF results require follow up or explanation, we will call you with instructions. Clinically stable results will be released to your [facility]. If you have not heard from [place] or cannot find your results in [facility] in 2 weeks please contact our office at [phone].  If you are not yet signed up for [facility], please consider signing up.

## 2017-10-30 ENCOUNTER — Telehealth: Payer: Self-pay | Admitting: Family Medicine

## 2017-10-30 ENCOUNTER — Other Ambulatory Visit: Payer: Self-pay | Admitting: *Deleted

## 2017-10-30 MED ORDER — AMLODIPINE BESYLATE 5 MG PO TABS: 5.0000 mg | ORAL_TABLET | Freq: Every day | ORAL | 1 refills | Status: DC

## 2017-10-30 NOTE — Telephone Encounter (Unsigned)
Copied from [place] [id number]. Topic: Quick Communication - Rx Refill/Question >> Oct 30, 2017  9:40 AM Percell Belt A wrote: Medication: amLODipine (NORVASC) 5 MG tablet [[id number]]   Has the patient contacted their pharmacy? No  (Agent: If no, request that the patient contact the pharmacy for the refill.) (Agent: If yes, when and what did the pharmacy advise?)  Preferred Pharmacy (with phone number or street name):  [facility], [address] AT [address] [phone] (Phone) / pt made an appt for Aug   Agent: Please be advised that RX refills may take up to 3 business days. We ask that you follow-up with your pharmacy.

## 2017-10-30 NOTE — Telephone Encounter (Signed)
Rx done. 

## 2017-11-19 NOTE — Progress Notes (Signed)
  HPI:  Using dictation device. Unfortunately this device frequently misinterprets words/phrases.  Kristina Bridges is a pleasant 77 y.o. here for follow up. Chronic medical problems summarized below were reviewed for changes and stability and were updated as needed below. These issues and their treatment remain stable for the most part.  Reports she is doing well on the Norvasc, and is taking it daily.  She continues to get regular exercise every day and drinks plenty water, tries to eat healthy and also has a very strong [place] faith. Denies CP, SOB, DOE, treatment intolerance or new symptoms. Due for lab Prefers to avoid medications and vaccines.  Continues to decline breast cancer screening, all vaccines and bone density testing.  AWV 05/23/17 HTN/Mild HLD: -finally agreed to start medication 02/2014 with Norvasc 5 mg -she exercises and walks daily -no cp, sob, doe, swelling or palpitations  Seasonal allergies: -reports: doing well -denies: symptoms this season   ROS: See pertinent positives and negatives per HPI.  Past Medical History:  Diagnosis Date  . Breast cancer ([place])    remote, tx with surgery and radiation - no recurrence  . Chicken pox   . Hypertension   . Seasonal allergies     Past Surgical History:  Procedure Laterality Date  . BREAST BIOPSY  1992    Family History  Problem Relation Age of Onset  . Hypertension Unknown   . Hypertension Mother   . Cancer Sister   . Cancer Brother        throat    SOCIAL HX: See HPI   Current Outpatient Medications:  .  acetaminophen (TYLENOL) 500 MG tablet, Take 500 mg by mouth as needed., Disp: , Rfl:  .  amLODipine (NORVASC) 5 MG tablet, Take 1 tablet (5 mg total) by mouth daily., Disp: 90 tablet, Rfl: 1 .  Multiple Vitamin (MULTIVITAMIN) capsule, Take 1 capsule by mouth daily., Disp: , Rfl:   EXAM:  Vitals:   11/20/17 1035  BP: 118/68  Pulse: 60  Temp: 98.1 F (36.7 C)    Body mass index is  22.21 kg/m.  GENERAL: vitals reviewed and listed above, alert, oriented, appears well hydrated and in no acute distress  HEENT: atraumatic, conjunttiva clear, no obvious abnormalities on inspection of external nose and ears  NECK: no obvious masses on inspection  LUNGS: clear to auscultation bilaterally, no wheezes, rales or rhonchi, good air movement  CV: HRRR, no peripheral edema  MS: moves all extremities without noticeable abnormality  PSYCH: pleasant and cooperative, no obvious depression or anxiety  ASSESSMENT AND PLAN:  Discussed the following assessment and plan:  No diagnosis found.  -Blood pressure looks great today, check BMP -Wellness visit next check -Congratulated on healthy lifestyle and encouraged her to continue  There are no Patient Instructions on file for this visit.  Lucretia Kern, DO

## 2017-11-20 ENCOUNTER — Encounter: Payer: Self-pay | Admitting: Family Medicine

## 2017-11-20 ENCOUNTER — Ambulatory Visit (INDEPENDENT_AMBULATORY_CARE_PROVIDER_SITE_OTHER): Payer: Medicare Other | Admitting: Family Medicine

## 2017-11-20 VITALS — BP 118/68 | HR 60 | Temp 98.1°F | Ht 64.0 in | Wt 129.4 lb

## 2017-11-20 DIAGNOSIS — I10 Essential (primary) hypertension: Secondary | ICD-10-CM

## 2017-11-20 LAB — BASIC METABOLIC PANEL
BUN: 15 mg/dL (ref 6–23)
CALCIUM: 9.9 mg/dL (ref 8.4–10.5)
CHLORIDE: 102 meq/L (ref 96–112)
CO2: 32 meq/L (ref 19–32)
CREATININE: 0.83 mg/dL (ref 0.40–1.20)
GFR: 85.83 mL/min (ref >60.00)
Glucose, Bld: 93 mg/dL (ref 70–99)
Potassium: 4.9 mEq/L (ref 3.5–5.1)
SODIUM: 138 meq/L (ref 135–145)

## 2017-11-20 NOTE — Patient Instructions (Signed)
BEFORE YOU LEAVE: -lab -follow up: AWV and f/u in Feb 2020  We have ordered labs or studies at this visit. It can take up to 1-2 weeks for results and processing. IF results require follow up or explanation, we will call you with instructions. Clinically stable results will be released to your [facility]. If you have not heard from [place] or cannot find your results in [facility] in 2 weeks please contact our office at [phone].  If you are not yet signed up for [facility], please consider signing up.

## 2018-02-26 ENCOUNTER — Telehealth: Payer: Self-pay

## 2018-02-26 DIAGNOSIS — Z1239 Encounter for other screening for malignant neoplasm of breast: Secondary | ICD-10-CM

## 2018-02-26 NOTE — Telephone Encounter (Signed)
Copied from [place] [id number]. Topic: Referral - Request for Referral >> Feb 26, 2018 10:02 AM Scherrie Gerlach wrote: Pt thought Dr Maudie Mercury was making her appt for mammogram but she never got a call. Pt would like you to schedule at the breast center around 11 am any day

## 2018-02-26 NOTE — Telephone Encounter (Signed)
Order entered.  I called the pt and left a detailed message someone will give her a call with appt info and if she has not heard anything to call the [place] at [phone] for an appt.

## 2018-02-27 ENCOUNTER — Other Ambulatory Visit: Payer: Self-pay | Admitting: Family Medicine

## 2018-02-27 DIAGNOSIS — Z1231 Encounter for screening mammogram for malignant neoplasm of breast: Secondary | ICD-10-CM

## 2018-04-12 ENCOUNTER — Ambulatory Visit
Admission: RE | Admit: 2018-04-12 | Discharge: 2018-04-12 | Disposition: A | Discharge status: Home / Self Care | Payer: Medicare Other | Source: Ambulatory Visit | Attending: Family Medicine | Admitting: Family Medicine

## 2018-04-12 DIAGNOSIS — Z1231 Encounter for screening mammogram for malignant neoplasm of breast: Secondary | ICD-10-CM

## 2018-04-12 HISTORY — DX: Personal history of irradiation: Z92.3

## 2018-04-13 ENCOUNTER — Other Ambulatory Visit: Payer: Self-pay | Admitting: Family Medicine

## 2018-04-13 DIAGNOSIS — R928 Other abnormal and inconclusive findings on diagnostic imaging of breast: Secondary | ICD-10-CM

## 2018-04-18 ENCOUNTER — Ambulatory Visit
Admission: RE | Admit: 2018-04-18 | Discharge: 2018-04-18 | Disposition: A | Discharge status: Home / Self Care | Payer: Medicare Other | Source: Ambulatory Visit | Attending: Family Medicine | Admitting: Family Medicine

## 2018-04-18 ENCOUNTER — Other Ambulatory Visit: Payer: Self-pay | Admitting: Family Medicine

## 2018-04-18 DIAGNOSIS — R928 Other abnormal and inconclusive findings on diagnostic imaging of breast: Secondary | ICD-10-CM

## 2018-04-18 DIAGNOSIS — N6310 Unspecified lump in the right breast, unspecified quadrant: Secondary | ICD-10-CM

## 2018-04-18 DIAGNOSIS — R2231 Localized swelling, mass and lump, right upper limb: Secondary | ICD-10-CM

## 2018-04-18 DIAGNOSIS — N6489 Other specified disorders of breast: Secondary | ICD-10-CM | POA: Diagnosis not present

## 2018-04-20 ENCOUNTER — Ambulatory Visit
Admission: RE | Admit: 2018-04-20 | Discharge: 2018-04-20 | Disposition: A | Discharge status: Home / Self Care | Payer: Medicare Other | Source: Ambulatory Visit | Attending: Family Medicine | Admitting: Family Medicine

## 2018-04-20 DIAGNOSIS — N6310 Unspecified lump in the right breast, unspecified quadrant: Secondary | ICD-10-CM

## 2018-04-20 DIAGNOSIS — N6311 Unspecified lump in the right breast, upper outer quadrant: Secondary | ICD-10-CM | POA: Diagnosis not present

## 2018-04-20 DIAGNOSIS — R2231 Localized swelling, mass and lump, right upper limb: Secondary | ICD-10-CM

## 2018-04-20 DIAGNOSIS — C50411 Malignant neoplasm of upper-outer quadrant of right female breast: Secondary | ICD-10-CM | POA: Diagnosis not present

## 2018-04-20 DIAGNOSIS — C773 Secondary and unspecified malignant neoplasm of axilla and upper limb lymph nodes: Secondary | ICD-10-CM | POA: Diagnosis not present

## 2018-04-20 DIAGNOSIS — R590 Localized enlarged lymph nodes: Secondary | ICD-10-CM | POA: Diagnosis not present

## 2018-04-23 ENCOUNTER — Telehealth: Payer: Self-pay | Admitting: Family Medicine

## 2018-04-23 NOTE — Telephone Encounter (Signed)
I called the pt and informed her of the message below

## 2018-04-23 NOTE — Telephone Encounter (Signed)
Please let her know that Dr. Maudie Mercury is out today, but we will forward this to her so she is aware and will be following along with her. Please let us know if any concerns along the way. It looks like she has an upcoming appointment with Dr. Maudie Mercury next month so I recommend she keep that as it will be good timing to touch base on everything.

## 2018-04-23 NOTE — Telephone Encounter (Signed)
Copied from [place] [phone]. Topic: Quick Communication - See Telephone Encounter >> Apr 23, 2018  1:27 PM Margot Ables wrote: CRM for notification. See Telephone encounter for: 04/23/18.  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 Pt is wanting to make sure Dr. Maudie Mercury is aware and knows about her care moving forward. She was told she'll have to go to [facility] on 1/22 and is waiting for more information.

## 2018-04-24 ENCOUNTER — Other Ambulatory Visit: Payer: Self-pay | Admitting: Family Medicine

## 2018-04-27 ENCOUNTER — Encounter: Payer: Self-pay | Admitting: *Deleted

## 2018-04-27 ENCOUNTER — Telehealth: Payer: Self-pay | Admitting: Hematology

## 2018-04-27 DIAGNOSIS — Z170 Estrogen receptor positive status [ER+]: Principal | ICD-10-CM

## 2018-04-27 DIAGNOSIS — C50411 Malignant neoplasm of upper-outer quadrant of right female breast: Secondary | ICD-10-CM | POA: Insufficient documentation

## 2018-04-27 NOTE — Telephone Encounter (Signed)
Spoke to patient to confirm afternoon BC appointment for 1/22, packet will be mailed to patient °

## 2018-04-30 NOTE — Progress Notes (Signed)
[facility]   Telephone:(336) [phone] Fax:([phone]) [place] Note   Patient Care Team: Lucretia Kern, DO as PCP - General (Family Medicine) Stark Klein, MD as Consulting Physician (General Surgery) Truitt Merle, MD as Consulting Physician (Hematology) Gery Pray, MD as Consulting Physician (Radiation Oncology)  Date of Service:  05/02/2018   CHIEF COMPLAINTS/PURPOSE OF CONSULTATION:  Newly Diagnosed Right Breast Cancer      Malignant neoplasm of upper-outer quadrant of right breast in female, estrogen receptor positive ([place])   04/18/2018 Mammogram    Diagnostic [place] 04/18/18  IMPRESSION: Suspicious mass measuring 2.4 x 1.3 centimeters in the retroareolar region of the RIGHT breast  Enlarged RIGHT axillary lymph node.    04/20/2018 Initial Biopsy    Diagnosis 04/20/18 1. Breast, right, needle core biopsy, 11 o'clock - INVASIVE DUCTAL CARCINOMA. - SEE COMMENT. 2. Lymph node, needle/core biopsy, right axilla - METASTATIC CARCINOMA IN 1 OF 1 LYMPH NODE (1/1). - SEE COMMENT. Microscopic Comment 1. The carcinoma appears grade I. A breast prognostic profile will be performed and the results reported separately. 2. The carcinoma in part 2 is somewhat morphologically dissimilar from that seen in part 1. A breast prognostic profile will also be performed on part 2 and the results reported separately. The results were called to [facility] on 04/23/2018. (JBK:ecj 04/23/2018)    04/20/2018 Receptors her2    IMMUNOHISTOCHEMICAL AND MORPHOMETRIC ANALYSIS PERFORMED MANUALLY The tumor cells are NEGATIVE for Her2 (1+). Estrogen Receptor: 100%, POSITIVE, STRONG STAINING INTENSITY Progesterone Receptor: 50%, POSITIVE, STRONG STAINING INTENSITY Proliferation Marker Ki67: 40%    04/27/2018 Initial Diagnosis    Malignant neoplasm of upper-outer quadrant of right breast in female, estrogen receptor positive (Kristina Bridges)      HISTORY OF  PRESENTING ILLNESS:  Kristina Bridges 78 y.o. female is a here because of newly diagnosed right breast cancer. The patient presents to the breast clinic today accompanied by her 2 daughters.  Her breast mass was found by screening mammogram. She notes she felt the right breast mass herself. She notes she has not been doing yearly mammograms as she feels she can find it by palpation better. She denies changes in her appetite or weight. She overall feels at baseline.   Socially she does not drink or smoke. She is divorced and retired. She is very independent and lives by herself.   They have a PMHx of HTN, on amlodipine. She has a h/o of left breast cancer treated with surgery and radiation. She did not take chemo or AI.  She notes her sister had breast cancer, 1 brother had prostate and 1 brother had throat cancer. Her daughter has CML and her other daughter had cervical cancer.     GYN HISTORY  Menarchal: 13 LMP:  Contraceptive: No HRT: No G3, first born at 78yo  REVIEW OF SYSTEMS:    Constitutional: Denies fevers, chills or abnormal night sweats Eyes: Denies blurriness of vision, double vision or watery eyes Ears, nose, mouth, throat, and face: Denies mucositis or sore throat Respiratory: Denies cough, dyspnea or wheezes Cardiovascular: Denies palpitation, chest discomfort or lower extremity swelling Gastrointestinal:  Denies nausea, heartburn or change in bowel habits Skin: Denies abnormal skin rashes Lymphatics: Denies new lymphadenopathy or easy bruising Neurological:Denies numbness, tingling or new weaknesses Behavioral/Psych: Mood is stable, no new changes  All other systems were reviewed with the patient and are negative.   MEDICAL HISTORY:  Past Medical History:  Diagnosis Date  .  Breast cancer ([place])    remote, tx with surgery and radiation - no recurrence  . Chicken pox   . Hypertension   . Personal history of radiation therapy   . Seasonal allergies      SURGICAL HISTORY: Past Surgical History:  Procedure Laterality Date  . BREAST BIOPSY  1992  . BREAST LUMPECTOMY Left     SOCIAL HISTORY: Social History   Socioeconomic History  . Marital status: Divorced    Spouse name: Not on file  . Number of children: Not on file  . Years of education: Not on file  . Highest education level: Not on file  Occupational History  . Not on file  Social Needs  . Financial resource strain: Not on file  . Food insecurity:    Worry: Not on file    Inability: Not on file  . Transportation needs:    Medical: Not on file    Non-medical: Not on file  Tobacco Use  . Smoking status: Never Smoker  . Smokeless tobacco: Never Used  Substance and Sexual Activity  . Alcohol use: No  . Drug use: No  . Sexual activity: Not on file  Lifestyle  . Physical activity:    Days per week: Not on file    Minutes per session: Not on file  . Stress: Not on file  Relationships  . Social connections:    Talks on phone: Not on file    Gets together: Not on file    Attends religious service: Not on file    Active member of club or organization: Not on file    Attends meetings of clubs or organizations: Not on file    Relationship status: Not on file  . Intimate partner violence:    Fear of current or ex partner: Not on file    Emotionally abused: Not on file    Physically abused: Not on file    Forced sexual activity: Not on file  Other Topics Concern  . Not on file  Social History Narrative   Work or School: retired, used to work in [employer] Situation: lives alone - raised a grandson but he has moved out, he comes to visit, has a lot of good friends - two daughters sylvia and zena with health issues      Spiritual Beliefs: Christian, haas good support      Lifestyle: exercises every day and eat healthy          FAMILY HISTORY: Family History  Problem Relation Age of Onset  . Hypertension Other   . Hypertension Mother   .  Cancer Sister        ovarian cancer  . Cancer Brother        throat cancer   . Cancer Brother        prostate cancer   . Cancer Daughter        CML  . Cancer Daughter        cervical cancer    ALLERGIES:  has No Known Allergies.  MEDICATIONS:  Current Outpatient Medications  Medication Sig Dispense Refill  . acetaminophen (TYLENOL) 500 MG tablet Take 500 mg by mouth as needed.    Marland Kitchen amLODipine (NORVASC) 5 MG tablet TAKE 1 TABLET(5 MG) BY MOUTH DAILY 90 tablet 1  . Multiple Vitamin (MULTIVITAMIN) capsule Take 1 capsule by mouth daily.     No current facility-administered medications for this visit.     PHYSICAL  EXAMINATION: ECOG PERFORMANCE STATUS: 0 - Asymptomatic  Vitals:   05/02/18 1244  BP: (!) 172/98  Pulse: 82  Resp: 18  Temp: 98.2 F (36.8 C)  SpO2: 99%   Filed Weights   05/02/18 1244  Weight: 127 lb 8 oz (57.8 kg)    GENERAL:alert, no distress and comfortable SKIN: skin color, texture, turgor are normal, no rashes or significant lesions EYES: normal, conjunctiva are pink and non-injected, sclera clear OROPHARYNX:no exudate, no erythema and lips, buccal mucosa, and tongue normal  NECK: supple, thyroid normal size, non-tender, without nodularity LYMPH:  no palpable lymphadenopathy in the cervical, axillary or inguinal LUNGS: clear to auscultation and percussion with normal breathing effort HEART: regular rate & rhythm and no murmurs and no lower extremity edema ABDOMEN:abdomen soft, non-tender and normal bowel sounds Musculoskeletal:no cyanosis of digits and no clubbing  PSYCH: alert & oriented x 3 with fluent speech NEURO: no focal motor/sensory deficits BREAST: (+) Palpable 1.5cm right axillary LN, movable. (+) Palpable 4x3.5cm mass under right nipple/areola,  (+) S/p left breast lumpectomy: surgical incision healed well with nipple retraction, no other palpable breast mass or axillary adenopathy.  LABORATORY DATA:  I have reviewed the data as listed CBC  Latest Ref Rng & Units 05/02/2018 05/23/2017 03/17/2016  WBC 4.0 - 10.5 K/uL 8.5 6.6 8.7  Hemoglobin 12.0 - 15.0 g/dL 13.7 14.1 13.8  Hematocrit 36.0 - 46.0 % 42.6 42.2 41.3  Platelets 150 - 400 K/uL 267 314.0 265.0    CMP Latest Ref Rng & Units 05/02/2018 11/20/2017 05/23/2017  Glucose 70 - 99 mg/dL 91 93 94  BUN 8 - 23 mg/dL _0 Creatinine 0.44 - 1.00 mg/dL 0.85 0.83 0.71  Sodium 135 - 145 mmol/L 139 138 139  Potassium 3.5 - 5.1 mmol/L 4.2 4.9 4.5  Chloride 98 - 111 mmol/L 101 102 102  CO2 22 - 32 mmol/L 29 32 30  Calcium 8.9 - 10.3 mg/dL 9.3 9.9 9.7  Total Protein 6.5 - 8.1 g/dL 7.4 - -  Total Bilirubin 0.3 - 1.2 mg/dL 0.6 - -  Alkaline Phos 38 - 126 U/L 103 - -  AST 15 - 41 U/L 18 - -  ALT 0 - 44 U/L 13 - -     RADIOGRAPHIC STUDIES: I have personally reviewed the radiological images as listed and agreed with the findings in the report. US Breast Ltd Uni Right Inc Axilla  Result Date: 04/18/2018 CLINICAL DATA:  Patient returns after new baseline screening exam for evaluation of a RIGHT breast mass associated with distortion. History of LEFT lumpectomy 1992. EXAM: DIGITAL DIAGNOSTIC RIGHT MAMMOGRAM WITH CAD AND TOMO ULTRASOUND RIGHT BREAST COMPARISON:  04/12/2018 ACR Breast Density Category b: There are scattered areas of fibroglandular density. FINDINGS: Additional 2-D and 3-D images are performed. These views confirm presence of mass and distortion in the immediate retroareolar region of the RIGHT breast. Mammographic images were processed with CAD. On physical exam, there is a firm retroareolar mass in the RIGHT breast. Mild retraction of the RIGHT nipple. I palpate a mass in the RIGHT axilla. Targeted ultrasound is performed, showing an irregular hypoechoic mass and numerous surrounding oval masses in the retroareolar region of the LEFT breast. Given the heterogeneity, is difficult to measure the mass but is estimated to be 3.6 x 2.2 x 2.2 centimeters. Evaluation of the RIGHT axilla  shows an enlarged lymph node with thickened cortex, measuring 2.4 x 1.3 centimeters. Other normal appearing RIGHT axillary lymph nodes are also present.  IMPRESSION: Suspicious mass in the retroareolar region of the RIGHT breast. Enlarged RIGHT axillary lymph node. RECOMMENDATION: Ultrasound-guided core biopsy of retroareolar mass in the RIGHT breast. Ultrasound-guided core biopsy of enlarged RIGHT axillary lymph node. I have discussed the findings and recommendations with the patient. Results were also provided in writing at the conclusion of the visit. If applicable, a reminder letter will be sent to the patient regarding the next appointment. BI-RADS CATEGORY  5: Highly suggestive of malignancy. Electronically Signed   By: Nolon Nations M.D.   On: 04/18/2018 12:25   Mm Diag Breast Tomo Uni Right  Result Date: 04/18/2018 CLINICAL DATA:  Patient returns after new baseline screening exam for evaluation of a RIGHT breast mass associated with distortion. History of LEFT lumpectomy 1992. EXAM: DIGITAL DIAGNOSTIC RIGHT MAMMOGRAM WITH CAD AND TOMO ULTRASOUND RIGHT BREAST COMPARISON:  04/12/2018 ACR Breast Density Category b: There are scattered areas of fibroglandular density. FINDINGS: Additional 2-D and 3-D images are performed. These views confirm presence of mass and distortion in the immediate retroareolar region of the RIGHT breast. Mammographic images were processed with CAD. On physical exam, there is a firm retroareolar mass in the RIGHT breast. Mild retraction of the RIGHT nipple. I palpate a mass in the RIGHT axilla. Targeted ultrasound is performed, showing an irregular hypoechoic mass and numerous surrounding oval masses in the retroareolar region of the LEFT breast. Given the heterogeneity, is difficult to measure the mass but is estimated to be 3.6 x 2.2 x 2.2 centimeters. Evaluation of the RIGHT axilla shows an enlarged lymph node with thickened cortex, measuring 2.4 x 1.3 centimeters. Other normal  appearing RIGHT axillary lymph nodes are also present. IMPRESSION: Suspicious mass in the retroareolar region of the RIGHT breast. Enlarged RIGHT axillary lymph node. RECOMMENDATION: Ultrasound-guided core biopsy of retroareolar mass in the RIGHT breast. Ultrasound-guided core biopsy of enlarged RIGHT axillary lymph node. I have discussed the findings and recommendations with the patient. Results were also provided in writing at the conclusion of the visit. If applicable, a reminder letter will be sent to the patient regarding the next appointment. BI-RADS CATEGORY  5: Highly suggestive of malignancy. Electronically Signed   By: Nolon Nations M.D.   On: 04/18/2018 12:25   Mm 3d Screen Breast Bilateral  Result Date: 04/12/2018 CLINICAL DATA:  Screening. History of LEFT breast cancer and lumpectomy 20 years ago. EXAM: DIGITAL SCREENING BILATERAL MAMMOGRAM WITH TOMO AND CAD COMPARISON:  None. Most recent prior mammograms in 2003 have been purged. ACR Breast Density Category b: There are scattered areas of fibroglandular density. FINDINGS: In the right breast, a possible mass/distortion warrants further evaluation. In the left breast, no findings suspicious for malignancy. LEFT lumpectomy changes are identified. Images were processed with CAD. IMPRESSION: Further evaluation is suggested for possible mass/distortion in the right breast. RECOMMENDATION: Diagnostic mammogram and possibly ultrasound of the right breast. (Code:[id number]) The patient will be contacted regarding the findings, and additional imaging will be scheduled. BI-RADS CATEGORY  0: Incomplete. Need additional imaging evaluation and/or prior mammograms for comparison. Electronically Signed   By: Margarette Canada M.D.   On: 04/12/2018 13:54   [place] Axillary Node Core Biopsy Right  Result Date: 04/20/2018 CLINICAL DATA:  78 year old female presenting for ultrasound-guided biopsy of a right breast mass and a right axillary lymph node. EXAM: ULTRASOUND  GUIDED RIGHT BREAST CORE NEEDLE BIOPSY COMPARISON:  Previous exam(s). FINDINGS: I met with the patient and we discussed the procedure of ultrasound-guided biopsy, including benefits and alternatives. We  discussed the high likelihood of a successful procedure. We discussed the risks of the procedure, including infection, bleeding, tissue injury, clip migration, and inadequate sampling. Informed written consent was given. The usual time-out protocol was performed immediately prior to the procedure. #1 Lesion quadrant: Upper-outer quadrant Using sterile technique and 1% Lidocaine as local anesthetic, under direct ultrasound visualization, a 14 gauge spring-loaded device was used to perform biopsy of the 11:00 position of the retroareolar right breast mass using a lateral approach. At the conclusion of the procedure a ribbon shaped tissue marker clip was deployed into the biopsy cavity. -------------------------------------------------------------------------------------------------------------------------------------------- #2 Lesion quadrant: Right axilla Using sterile technique and 1% Lidocaine as local anesthetic, under direct ultrasound visualization, a 14 gauge spring-loaded device was used to perform biopsy of an enlarged right axillary lymph node using an inferior approach. At the conclusion of the procedure a HydroMARK spiral shaped tissue marker clip was deployed into the biopsy cavity. Follow up 2 view mammogram was performed and dictated separately. IMPRESSION: 1. Ultrasound guided biopsy of a retroareolar right breast mass at the 11 o'clock position. No apparent complications. 2. Ultrasound guided biopsy of a right axillary lymph node. No apparent complications. Electronically Signed   By: Ammie Ferrier M.D.   On: 04/20/2018 10:13   Mm Clip Placement Right  Result Date: 04/20/2018 CLINICAL DATA:  Post biopsy mammogram the right breast for clip placement. EXAM: DIAGNOSTIC RIGHT MAMMOGRAM POST  ULTRASOUND BIOPSY COMPARISON:  Previous exam(s). FINDINGS: Mammographic images were obtained following ultrasound guided biopsy of a retroareolar right breast mass and a right axillary lymph node. Ribbon shaped biopsy marking clip is well positioned within the biopsied retroareolar right breast mass. The clip in the right axilla can not be the field of view. IMPRESSION: 1. Appropriate positioning of the ribbon shaped biopsy marking clip in the retroareolar right breast. 2.  Non visualization of the HydroMARK clip in the right axilla. Final Assessment: Post Procedure Mammograms for Marker Placement Electronically Signed   By: Ammie Ferrier M.D.   On: 04/20/2018 10:39   [place] Rt Breast Bx W Loc Dev 1st Lesion Img Bx Spec US Guide  Result Date: 04/20/2018 CLINICAL DATA:  78 year old female presenting for ultrasound-guided biopsy of a right breast mass and a right axillary lymph node. EXAM: ULTRASOUND GUIDED RIGHT BREAST CORE NEEDLE BIOPSY COMPARISON:  Previous exam(s). FINDINGS: I met with the patient and we discussed the procedure of ultrasound-guided biopsy, including benefits and alternatives. We discussed the high likelihood of a successful procedure. We discussed the risks of the procedure, including infection, bleeding, tissue injury, clip migration, and inadequate sampling. Informed written consent was given. The usual time-out protocol was performed immediately prior to the procedure. #1 Lesion quadrant: Upper-outer quadrant Using sterile technique and 1% Lidocaine as local anesthetic, under direct ultrasound visualization, a 14 gauge spring-loaded device was used to perform biopsy of the 11:00 position of the retroareolar right breast mass using a lateral approach. At the conclusion of the procedure a ribbon shaped tissue marker clip was deployed into the biopsy cavity.  -------------------------------------------------------------------------------------------------------------------------------------------- #2 Lesion quadrant: Right axilla Using sterile technique and 1% Lidocaine as local anesthetic, under direct ultrasound visualization, a 14 gauge spring-loaded device was used to perform biopsy of an enlarged right axillary lymph node using an inferior approach. At the conclusion of the procedure a HydroMARK spiral shaped tissue marker clip was deployed into the biopsy cavity. Follow up 2 view mammogram was performed and dictated separately. IMPRESSION: 1. Ultrasound guided biopsy of a retroareolar right  breast mass at the 11 o'clock position. No apparent complications. 2. Ultrasound guided biopsy of a right axillary lymph node. No apparent complications. Electronically Signed   By: Ammie Ferrier M.D.   On: 04/20/2018 10:13    ASSESSMENT & PLAN:  Kristina Bridges is a 78 y.o. female with a history of HTN and H/o right breast cancer.    1. Malignant neoplasm of upper-outer quadrant of right breast , Stage II, c (T2N1M0 ), ER/PR: +, HER2 -, Grade I -We discussed her image findings and the biopsy results in great details. -She is a candidate for lumpectomy, targeted axillary node dissection. She is agreeable with that. She was seen by Dr. Barry Dienes today and likely will proceed with surgery soon.  -Given her positive lymph node, I recommend a Mammaprint test on the surgical sample and we'll make a decision about adjuvant chemotherapy based on the results. Written material of this test was given to her. She is older but in great health overall and would be a good candidate for chemotherapy if her Mammaprint recurrence score is high. However pt is very reluctant to consider chemo, will hold on mammaprint test for now, and I will see her after surgery.  -I discussed with patient that if her surgical pathology shows several LN are cancerous, I will recommend  adjuvant chemo.  -The risk of recurrence depends on the stage and biology of the tumor. She is stage II, with ER/PR positive and HER2 negative markers. I discussed this is likely low risk disease. -She was also seen by radiation oncologist Dr. Sondra Come today. Given her positive lymph node she would proceed with adjuvant radiation. She is agreeable -Giving the strong ER and PR expression in her postmenopausal status, I recommend adjuvant endocrine therapy with aromatase inhibitor with anastrozole for a total of 5-10 years to reduce the risk of cancer recurrence. Potential benefits and side effects were discussed with patient and she is interested. -I discussed the option of starting with anastrozole to shrink her tumor before surgery. She would like to proceed with surgery first.  -We also discussed the breast cancer surveillance after her surgery. She will continue annual screening mammogram, self exam, and a routine office visit with lab and exam with [place]. -Labs reviewed today, CBC and CMP are WNL.  -F/u after surgery, may discuss the role of  mammaprint test again    2. H/o Left breast cancer -She was diagnosed in 19. Treated with Left breast lumpectomy and radiation in [place].  -Given this is her second breast cancer and strong family history of cancer she is eligible for genetic cancer.  -She is not interested in genetic testing   3. HTN -Continue medication and follow-up with PCP  PLAN:  -she will proceed with surgery first, lumpectomy and tolerated lymph node dissection by Dr. Barry Dienes  -f/u with her after surgery , may discuss mammaprint test again on next visit   No orders of the defined types were placed in this encounter.   All questions were answered. The patient knows to call the clinic with any problems, questions or concerns. I spent 40 minutes counseling the patient face to face. The total time spent in the appointment was 50 minutes and more than 50% was on counseling.       Truitt Merle, MD 05/02/2018 5:40 PM  I, Joslyn Devon, am acting as scribe for Truitt Merle, MD.   I have reviewed the above documentation for accuracy and completeness, and I agree with the above.

## 2018-05-02 ENCOUNTER — Encounter: Payer: Self-pay | Admitting: Hematology

## 2018-05-02 ENCOUNTER — Inpatient Hospital Stay: Payer: Medicare Other

## 2018-05-02 ENCOUNTER — Ambulatory Visit
Admission: RE | Admit: 2018-05-02 | Discharge: 2018-05-02 | Disposition: A | Discharge status: Home / Self Care | Payer: Medicare Other | Source: Ambulatory Visit | Attending: Radiation Oncology | Admitting: Radiation Oncology

## 2018-05-02 ENCOUNTER — Other Ambulatory Visit: Payer: Self-pay

## 2018-05-02 ENCOUNTER — Encounter: Payer: Self-pay | Admitting: Physical Therapy

## 2018-05-02 ENCOUNTER — Ambulatory Visit: Payer: Medicare Other | Attending: General Surgery | Admitting: Physical Therapy

## 2018-05-02 ENCOUNTER — Other Ambulatory Visit: Payer: Self-pay | Admitting: General Surgery

## 2018-05-02 ENCOUNTER — Ambulatory Visit (HOSPITAL_BASED_OUTPATIENT_CLINIC_OR_DEPARTMENT_OTHER): Payer: Medicare Other | Admitting: Genetics

## 2018-05-02 ENCOUNTER — Inpatient Hospital Stay: Payer: Medicare Other | Attending: Hematology | Admitting: Hematology

## 2018-05-02 VITALS — BP 172/98 | HR 82 | Temp 98.2°F | Resp 18 | Ht 64.5 in | Wt 127.5 lb

## 2018-05-02 DIAGNOSIS — Z923 Personal history of irradiation: Secondary | ICD-10-CM | POA: Diagnosis not present

## 2018-05-02 DIAGNOSIS — C50911 Malignant neoplasm of unspecified site of right female breast: Secondary | ICD-10-CM | POA: Diagnosis not present

## 2018-05-02 DIAGNOSIS — Z170 Estrogen receptor positive status [ER+]: Principal | ICD-10-CM

## 2018-05-02 DIAGNOSIS — Z8042 Family history of malignant neoplasm of prostate: Secondary | ICD-10-CM | POA: Diagnosis not present

## 2018-05-02 DIAGNOSIS — Z79899 Other long term (current) drug therapy: Secondary | ICD-10-CM

## 2018-05-02 DIAGNOSIS — C50411 Malignant neoplasm of upper-outer quadrant of right female breast: Secondary | ICD-10-CM | POA: Insufficient documentation

## 2018-05-02 DIAGNOSIS — C50111 Malignant neoplasm of central portion of right female breast: Secondary | ICD-10-CM | POA: Diagnosis not present

## 2018-05-02 DIAGNOSIS — Z1379 Encounter for other screening for genetic and chromosomal anomalies: Secondary | ICD-10-CM

## 2018-05-02 DIAGNOSIS — Z853 Personal history of malignant neoplasm of breast: Secondary | ICD-10-CM | POA: Diagnosis not present

## 2018-05-02 DIAGNOSIS — Z806 Family history of leukemia: Secondary | ICD-10-CM | POA: Diagnosis not present

## 2018-05-02 DIAGNOSIS — I10 Essential (primary) hypertension: Secondary | ICD-10-CM | POA: Insufficient documentation

## 2018-05-02 DIAGNOSIS — Z800 Family history of malignant neoplasm of digestive organs: Secondary | ICD-10-CM | POA: Diagnosis not present

## 2018-05-02 DIAGNOSIS — R293 Abnormal posture: Secondary | ICD-10-CM | POA: Diagnosis not present

## 2018-05-02 DIAGNOSIS — C773 Secondary and unspecified malignant neoplasm of axilla and upper limb lymph nodes: Secondary | ICD-10-CM | POA: Diagnosis not present

## 2018-05-02 DIAGNOSIS — Z8049 Family history of malignant neoplasm of other genital organs: Secondary | ICD-10-CM

## 2018-05-02 LAB — CBC WITH DIFFERENTIAL (CANCER CENTER ONLY)
ABS IMMATURE GRANULOCYTES: 0.02 10*3/uL (ref 0.00–0.07)
Basophils Absolute: 0 10*3/uL (ref 0.0–0.1)
Basophils Relative: 1 %
Eosinophils Absolute: 0 10*3/uL (ref 0.0–0.5)
Eosinophils Relative: 0 %
HCT: 42.6 % (ref 36.0–46.0)
Hemoglobin: 13.7 g/dL (ref 12.0–15.0)
Immature Granulocytes: 0 %
LYMPHS PCT: 28 %
Lymphs Abs: 2.4 10*3/uL (ref 0.7–4.0)
MCH: 30.2 pg (ref 26.0–34.0)
MCHC: 32.2 g/dL (ref 30.0–36.0)
MCV: 93.8 fL (ref 80.0–100.0)
Monocytes Absolute: 0.7 10*3/uL (ref 0.1–1.0)
Monocytes Relative: 9 %
NEUTROS ABS: 5.3 10*3/uL (ref 1.7–7.7)
Neutrophils Relative %: 62 %
Platelet Count: 267 10*3/uL (ref 150–400)
RBC: 4.54 MIL/uL (ref 3.87–5.11)
RDW: 12.9 % (ref 11.5–15.5)
WBC Count: 8.5 10*3/uL (ref 4.0–10.5)
nRBC: 0 % (ref 0.0–0.2)

## 2018-05-02 LAB — CMP (CANCER CENTER ONLY)
ALT: 13 U/L (ref 0–44)
AST: 18 U/L (ref 15–41)
Albumin: 4.1 g/dL (ref 3.5–5.0)
Alkaline Phosphatase: 103 U/L (ref 38–126)
Anion gap: 9 (ref 5–15)
BUN: 17 mg/dL (ref 8–23)
CO2: 29 mmol/L (ref 22–32)
Calcium: 9.3 mg/dL (ref 8.9–10.3)
Chloride: 101 mmol/L (ref 98–111)
Creatinine: 0.85 mg/dL (ref 0.44–1.00)
GFR, Est AFR Am: >60 mL/min (ref >60)
GFR, Estimated: >60 mL/min (ref >60)
Glucose, Bld: 91 mg/dL (ref 70–99)
POTASSIUM: 4.2 mmol/L (ref 3.5–5.1)
Sodium: 139 mmol/L (ref 135–145)
Total Bilirubin: 0.6 mg/dL (ref 0.3–1.2)
Total Protein: 7.4 g/dL (ref 6.5–8.1)

## 2018-05-02 NOTE — Progress Notes (Signed)
[place] [facility] Psychosocial Distress Screening Spiritual Care  Met with Kristina Bridges in [facility] to introduce [facility] team/resources, reviewing distress screen per protocol.  The patient scored a 0 on the Psychosocial Distress Thermometer which indicates no distress. Also assessed for distress and other psychosocial needs.   ONCBCN DISTRESS SCREENING 05/02/2018  Screening Type Initial Screening  Distress experienced in past week (1-10) 0  Referral to support programs Yes   The pt presented to Breast Clinic with her 2 daughters. The pt indicated that she is not experiencing any distress and feels well supported by her family, treatment team, and faith. The pt expressed no interest in support programs at this time, but she reported that she will reach out if any needs arise. The pt's daughters were also referred to support programs and expressed that they will reach out if any needs arise.  Follow up needed: No.   Doris Cheadle, Counseling Intern [phone]

## 2018-05-02 NOTE — Patient Instructions (Signed)

## 2018-05-02 NOTE — Progress Notes (Signed)
Radiation Oncology         [phone] ________________________________  Multidisciplinary Breast Oncology Clinic [facility]) Initial Outpatient Consultation  Name: Kristina Bridges MRN: [id number]  Date: 05/02/2018  DOB: 12-01-1940  CC:Kristina Bridges, Kristina Major, DO  Stark Klein, MD   REFERRING PHYSICIAN: Stark Klein, MD  DIAGNOSIS: The encounter diagnosis was Malignant neoplasm of upper-outer quadrant of right breast in female, estrogen receptor positive ([place]).  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 Malignant neoplasm of upper-outer quadrant of right breast in female, estrogen receptor positive ([place]) C50.411    Z17.0     HISTORY OF PRESENT ILLNESS::Kristina Bridges is a 78 y.o. female who is presenting to the office today for evaluation of her newly diagnosed breast cancer. She is accompanied by two daughters. She is doing well overall.   She had routine screening mammography on January 2 that showed a possible abnormality in the right breast. She underwent bilateral diagnostic mammography with tomography and right breast ultrasonography at [facility] on January 8 showing: suspicious mass in the retroareolar region of the right breast and enlarged right axillary lymph node. Prior to this mammogram the patient has noticed swelling along the nipple area and a palpable lump in the retroareolar area.  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 Prognostic indicators in the breast significant for: estrogen receptor, 100% positive and progesterone receptor, 50% positive, both with strong staining intensity. Proliferation marker Ki67 at 40%. HER2 negative. Prognostic indicators in the axillary node significant for: estrogen receptor, 100% positive with strong staining intensity and progesterone receptor, 0%  negative/ Proliferation marker Ki67 at 15%  Menarche: 78 years old Age at first live birth: 78 years old GP: GxP3 LMP: unknown Contraceptive: no HRT: no   The patient was referred today for presentation in the multidisciplinary conference.  Radiology studies and pathology slides were presented there for review and discussion of treatment options.  A consensus was discussed regarding potential next steps.  PREVIOUS RADIATION THERAPY: Yes left breast in 1992, 6.5 weeks of radiation, Dr. Danny Lawless  PAST MEDICAL HISTORY:  has a past medical history of Breast cancer ([place]), Chicken pox, Hypertension, Personal history of radiation therapy, and Seasonal allergies.    PAST SURGICAL HISTORY: Past Surgical History:  Procedure Laterality Date  . BREAST BIOPSY  1992  . BREAST LUMPECTOMY Left     FAMILY HISTORY: family history includes Cancer in her brother, brother, daughter, daughter, and sister; Hypertension in her mother and another family member.  SOCIAL HISTORY:  reports that she has never smoked. She has never used smokeless tobacco. She reports that she does not drink alcohol or use drugs.  ALLERGIES: Patient has no known allergies.  MEDICATIONS:  Current Outpatient Medications  Medication Sig Dispense Refill  . acetaminophen (TYLENOL) 500 MG tablet Take 500 mg by mouth as needed.    Marland Kitchen amLODipine (NORVASC) 5 MG tablet TAKE 1 TABLET(5 MG) BY MOUTH DAILY 90 tablet 1  . Multiple Vitamin (MULTIVITAMIN) capsule Take 1 capsule by mouth daily.     No current facility-administered medications for this encounter.     REVIEW OF SYSTEMS:  REVIEW OF SYSTEMS: A 10+ POINT REVIEW OF SYSTEMS WAS OBTAINED including neurology, dermatology, psychiatry, cardiac, respiratory, lymph, extremities, GI, GU, musculoskeletal, constitutional, reproductive, HEENT.On the provided form she had no  symptoms to report.    PHYSICAL EXAM:  Vitals with BMI 05/02/2018  Height 5' 4.5"  Weight 127 lbs 8 oz  BMI 54.98    Systolic 264  Diastolic 98  Pulse 82  Respirations 18   Lungs are clear to auscultation bilaterally. Heart has regular rate and rhythm. No palpable cervical, supraclavicular, or axillary adenopathy. Abdomen soft, non-tender, normal bowel sounds. Breast: Left breast with a lumpectomy scar in the upper outer quadrant, no palpable or visible signs of recurrence. Tattoos in place from her prior radiation therapy. Right breast the pt has a retroareolar mass, approximately 4 x 4.5 cm in size. No nipple discharge or bleeding. Patient also has a 1.5 cm palpable lymph node in the right axilla.   ECOG = 1  0 - Asymptomatic (Fully active, able to carry on all predisease activities without restriction)  1 - Symptomatic but completely ambulatory (Restricted in physically strenuous activity but ambulatory and able to carry out work of a light or sedentary nature. For example, light housework, office work)  2 - Symptomatic, <50% in bed during the day (Ambulatory and capable of all self care but unable to carry out any work activities. Up and about more than 50% of waking hours)  3 - Symptomatic, >50% in bed, but not bedbound (Capable of only limited self-care, confined to bed or chair 50% or more of waking hours)  4 - Bedbound (Completely disabled. Cannot carry on any self-care. Totally confined to bed or chair)  5 - Death   Eustace Pen MM, Creech RH, Tormey DC, et al. [phone]). "Toxicity and response criteria of the [facility]". [place] Oncol. 5 (6): 649-55  LABORATORY DATA:  Lab Results  Component Value Date   WBC 8.5 05/02/2018   HGB 13.7 05/02/2018   HCT 42.6 05/02/2018   MCV 93.8 05/02/2018   PLT 267 05/02/2018   Lab Results  Component Value Date   NA 139 05/02/2018   K 4.2 05/02/2018   CL 101 05/02/2018   CO2 29 05/02/2018   Lab Results  Component Value Date   ALT 13 05/02/2018   AST 18 05/02/2018   ALKPHOS 103 05/02/2018   BILITOT 0.6 05/02/2018     PULMONARY FUNCTION TEST:   Recent Review Flowsheet Data    There is no flowsheet data to display.      RADIOGRAPHY: US Breast Ltd Uni Right Inc Axilla  Result Date: 04/18/2018 CLINICAL DATA:  Patient returns after new baseline screening exam for evaluation of a RIGHT breast mass associated with distortion. History of LEFT lumpectomy 1992. EXAM: DIGITAL DIAGNOSTIC RIGHT MAMMOGRAM WITH CAD AND TOMO ULTRASOUND RIGHT BREAST COMPARISON:  04/12/2018 ACR Breast Density Category b: There are scattered areas of fibroglandular density. FINDINGS: Additional 2-D and 3-D images are performed. These views confirm presence of mass and distortion in the immediate retroareolar region of the RIGHT breast. Mammographic images were processed with CAD. On physical exam, there is a firm retroareolar mass in the RIGHT breast. Mild retraction of the RIGHT nipple. I palpate a mass in the RIGHT axilla. Targeted ultrasound is performed, showing an irregular hypoechoic mass and numerous surrounding oval masses in the retroareolar region of the LEFT breast. Given the heterogeneity, is difficult to measure the mass but is estimated to be 3.6 x 2.2 x 2.2 centimeters. Evaluation of the RIGHT axilla shows an enlarged lymph node with thickened cortex, measuring 2.4 x 1.3 centimeters. Other normal appearing RIGHT axillary lymph nodes are also present. IMPRESSION:  Suspicious mass in the retroareolar region of the RIGHT breast. Enlarged RIGHT axillary lymph node. RECOMMENDATION: Ultrasound-guided core biopsy of retroareolar mass in the RIGHT breast. Ultrasound-guided core biopsy of enlarged RIGHT axillary lymph node. I have discussed the findings and recommendations with the patient. Results were also provided in writing at the conclusion of the visit. If applicable, a reminder letter will be sent to the patient regarding the next appointment. BI-RADS CATEGORY  5: Highly suggestive of malignancy. Electronically Signed   By: Nolon Nations M.D.   On: 04/18/2018 12:25   Mm Diag Breast Tomo Uni Right  Result Date: 04/18/2018 CLINICAL DATA:  Patient returns after new baseline screening exam for evaluation of a RIGHT breast mass associated with distortion. History of LEFT lumpectomy 1992. EXAM: DIGITAL DIAGNOSTIC RIGHT MAMMOGRAM WITH CAD AND TOMO ULTRASOUND RIGHT BREAST COMPARISON:  04/12/2018 ACR Breast Density Category b: There are scattered areas of fibroglandular density. FINDINGS: Additional 2-D and 3-D images are performed. These views confirm presence of mass and distortion in the immediate retroareolar region of the RIGHT breast. Mammographic images were processed with CAD. On physical exam, there is a firm retroareolar mass in the RIGHT breast. Mild retraction of the RIGHT nipple. I palpate a mass in the RIGHT axilla. Targeted ultrasound is performed, showing an irregular hypoechoic mass and numerous surrounding oval masses in the retroareolar region of the LEFT breast. Given the heterogeneity, is difficult to measure the mass but is estimated to be 3.6 x 2.2 x 2.2 centimeters. Evaluation of the RIGHT axilla shows an enlarged lymph node with thickened cortex, measuring 2.4 x 1.3 centimeters. Other normal appearing RIGHT axillary lymph nodes are also present. IMPRESSION: Suspicious mass in the retroareolar region of the RIGHT breast. Enlarged RIGHT axillary lymph node. RECOMMENDATION: Ultrasound-guided core biopsy of retroareolar mass in the RIGHT breast. Ultrasound-guided core biopsy of enlarged RIGHT axillary lymph node. I have discussed the findings and recommendations with the patient. Results were also provided in writing at the conclusion of the visit. If applicable, a reminder letter will be sent to the patient regarding the next appointment. BI-RADS CATEGORY  5: Highly suggestive of malignancy. Electronically Signed   By: Nolon Nations M.D.   On: 04/18/2018 12:25   Mm 3d Screen Breast Bilateral  Result Date:  04/12/2018 CLINICAL DATA:  Screening. History of LEFT breast cancer and lumpectomy 20 years ago. EXAM: DIGITAL SCREENING BILATERAL MAMMOGRAM WITH TOMO AND CAD COMPARISON:  None. Most recent prior mammograms in 2003 have been purged. ACR Breast Density Category b: There are scattered areas of fibroglandular density. FINDINGS: In the right breast, a possible mass/distortion warrants further evaluation. In the left breast, no findings suspicious for malignancy. LEFT lumpectomy changes are identified. Images were processed with CAD. IMPRESSION: Further evaluation is suggested for possible mass/distortion in the right breast. RECOMMENDATION: Diagnostic mammogram and possibly ultrasound of the right breast. (Code:[id number]) The patient will be contacted regarding the findings, and additional imaging will be scheduled. BI-RADS CATEGORY  0: Incomplete. Need additional imaging evaluation and/or prior mammograms for comparison. Electronically Signed   By: Margarette Canada M.D.   On: 04/12/2018 13:54   [place] Axillary Node Core Biopsy Right  Result Date: 04/20/2018 CLINICAL DATA:  78 year old female presenting for ultrasound-guided biopsy of a right breast mass and a right axillary lymph node. EXAM: ULTRASOUND GUIDED RIGHT BREAST CORE NEEDLE BIOPSY COMPARISON:  Previous exam(s). FINDINGS: I met with the patient and we discussed the procedure of ultrasound-guided biopsy, including benefits and alternatives. We discussed  the high likelihood of a successful procedure. We discussed the risks of the procedure, including infection, bleeding, tissue injury, clip migration, and inadequate sampling. Informed written consent was given. The usual time-out protocol was performed immediately prior to the procedure. #1 Lesion quadrant: Upper-outer quadrant Using sterile technique and 1% Lidocaine as local anesthetic, under direct ultrasound visualization, a 14 gauge spring-loaded device was used to perform biopsy of the 11:00 position of the  retroareolar right breast mass using a lateral approach. At the conclusion of the procedure a ribbon shaped tissue marker clip was deployed into the biopsy cavity. -------------------------------------------------------------------------------------------------------------------------------------------- #2 Lesion quadrant: Right axilla Using sterile technique and 1% Lidocaine as local anesthetic, under direct ultrasound visualization, a 14 gauge spring-loaded device was used to perform biopsy of an enlarged right axillary lymph node using an inferior approach. At the conclusion of the procedure a HydroMARK spiral shaped tissue marker clip was deployed into the biopsy cavity. Follow up 2 view mammogram was performed and dictated separately. IMPRESSION: 1. Ultrasound guided biopsy of a retroareolar right breast mass at the 11 o'clock position. No apparent complications. 2. Ultrasound guided biopsy of a right axillary lymph node. No apparent complications. Electronically Signed   By: Ammie Ferrier M.D.   On: 04/20/2018 10:13   Mm Clip Placement Right  Result Date: 04/20/2018 CLINICAL DATA:  Post biopsy mammogram the right breast for clip placement. EXAM: DIAGNOSTIC RIGHT MAMMOGRAM POST ULTRASOUND BIOPSY COMPARISON:  Previous exam(s). FINDINGS: Mammographic images were obtained following ultrasound guided biopsy of a retroareolar right breast mass and a right axillary lymph node. Ribbon shaped biopsy marking clip is well positioned within the biopsied retroareolar right breast mass. The clip in the right axilla can not be the field of view. IMPRESSION: 1. Appropriate positioning of the ribbon shaped biopsy marking clip in the retroareolar right breast. 2.  Non visualization of the HydroMARK clip in the right axilla. Final Assessment: Post Procedure Mammograms for Marker Placement Electronically Signed   By: Ammie Ferrier M.D.   On: 04/20/2018 10:39   [place] Rt Breast Bx W Loc Dev 1st Lesion Img Bx Spec US  Guide  Result Date: 04/20/2018 CLINICAL DATA:  78 year old female presenting for ultrasound-guided biopsy of a right breast mass and a right axillary lymph node. EXAM: ULTRASOUND GUIDED RIGHT BREAST CORE NEEDLE BIOPSY COMPARISON:  Previous exam(s). FINDINGS: I met with the patient and we discussed the procedure of ultrasound-guided biopsy, including benefits and alternatives. We discussed the high likelihood of a successful procedure. We discussed the risks of the procedure, including infection, bleeding, tissue injury, clip migration, and inadequate sampling. Informed written consent was given. The usual time-out protocol was performed immediately prior to the procedure. #1 Lesion quadrant: Upper-outer quadrant Using sterile technique and 1% Lidocaine as local anesthetic, under direct ultrasound visualization, a 14 gauge spring-loaded device was used to perform biopsy of the 11:00 position of the retroareolar right breast mass using a lateral approach. At the conclusion of the procedure a ribbon shaped tissue marker clip was deployed into the biopsy cavity. -------------------------------------------------------------------------------------------------------------------------------------------- #2 Lesion quadrant: Right axilla Using sterile technique and 1% Lidocaine as local anesthetic, under direct ultrasound visualization, a 14 gauge spring-loaded device was used to perform biopsy of an enlarged right axillary lymph node using an inferior approach. At the conclusion of the procedure a HydroMARK spiral shaped tissue marker clip was deployed into the biopsy cavity. Follow up 2 view mammogram was performed and dictated separately. IMPRESSION: 1. Ultrasound guided biopsy of a retroareolar right breast  mass at the 11 o'clock position. No apparent complications. 2. Ultrasound guided biopsy of a right axillary lymph node. No apparent complications. Electronically Signed   By: Ammie Ferrier M.D.   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 The patient is familiar with rad tx in light of her previous left breast cancer and radiation therapy. If the patient ends up having a mastectomy she does understand that she will require postmastectomy radiation therapy given her node-positive disease.  Today, I talked to the patient and family about the findings and work-up thus far.  We discussed the natural history of breast cancer and general treatment, highlighting the role of radiotherapy in the management.  We discussed the available radiation techniques, and focused on the details of logistics and delivery.  We reviewed the anticipated acute and late sequelae associated with radiation in this setting.  The patient was encouraged to ask questions that I answered to the best of my ability.    PLAN:  1. Genetics testing 2. Right lumpectomy with targeted axillary dissection 3. Mammaprint 4. XRT 5. AI   ------------------------------------------------  Blair Promise, PhD, MD This document serves as a record of services personally performed by Gery Pray, MD. It was created on his behalf by Mary-Margaret Loma Messing, a trained medical scribe. The creation of this record is based on the scribe's personal observations and the provider's statements to them. This document has been checked and approved by the attending provider.

## 2018-05-02 NOTE — Therapy (Signed)
[place], [place], [address] Phone: [phone]   Fax:  [phone]  Physical Therapy Evaluation  Patient Details  Name: Kristina Bridges MRN: [id number] Date of Birth: 07-04-1940 Referring Provider (PT): Dr. Stark Klein   Encounter Date: 05/02/2018  PT End of Session - 05/02/18 1443    Visit Number  1    Number of Visits  2    Date for PT Re-Evaluation  06/27/18    PT Start Time  3220    PT Stop Time  2542   Also saw pt from 1410-1428 for a total of 18 minutes   PT Time Calculation (min)  12 min    Activity Tolerance  Patient tolerated treatment well    Behavior During Therapy  [facility] for tasks assessed/performed       Past Medical History:  Diagnosis Date  . Breast cancer ([place])    remote, tx with surgery and radiation - no recurrence  . Chicken pox   . Hypertension   . Personal history of radiation therapy   . Seasonal allergies     Past Surgical History:  Procedure Laterality Date  . BREAST BIOPSY  1992  . BREAST LUMPECTOMY Left     There were no vitals filed for this visit.   Subjective Assessment - 05/02/18 1406    Subjective  Patient reports she is here today to be seen by her medical team for her newly diagnosed right breast cancer.    Patient is accompained by:  Family member    Pertinent History  Patient was diagnosed on 04/12/2018 with right grade I invasive ductal carcinoma breast cancer. It measures 3.6 cm and is located in the upper outer quadrant. It is ER/PR positive and HER2 negative with a Ki67 of 40%. She has a positive axillary lymph node.  She has a history of left breast cancer in 1992 and underwent a left lumpectomy, sentinel node biopsy and radiation. She has had mild lymphedema in her left arm since surgery in 1992.    Patient Stated Goals  Reduce lymphedema risk and learn post op shoulder ROM HEP    Currently in Pain?  No/denies         [facility] PT  Assessment - 05/02/18 0001      Assessment   Medical Diagnosis  Right breast cancer    Referring Provider (PT)  Dr. Stark Klein    Onset Date/Surgical Date  04/12/18    Hand Dominance  Right    Prior Therapy  none      Precautions   Precautions  Other (comment)    Precaution Comments  active cancer; left arm lymphedema      Restrictions   Weight Bearing Restrictions  No      Balance Screen   Has the patient fallen in the past 6 months  No    Has the patient had a decrease in activity level because of a fear of falling?   No    Is the patient reluctant to leave their home because of a fear of falling?   No      Home Environment   Living Environment  Private residence    Living Arrangements  Alone    Available Help at Discharge  Family      Prior Function   Level of [place]  Retired    Leisure  She exercises regularly in her house and walks daily at least  45 minutes      Cognition   Overall Cognitive Status  Within Functional Limits for tasks assessed      Posture/Postural Control   Posture/Postural Control  Postural limitations    Postural Limitations  Forward head;Rounded Shoulders;Increased thoracic kyphosis      ROM / Strength   AROM / PROM / Strength  AROM;Strength      AROM   AROM Assessment Site  Shoulder;Cervical    Right/Left Shoulder  Right;Left    Right Shoulder Extension  50 Degrees    Right Shoulder Flexion  130 Degrees    Right Shoulder ABduction  154 Degrees    Right Shoulder Internal Rotation  63 Degrees    Right Shoulder External Rotation  72 Degrees    Left Shoulder Extension  50 Degrees    Left Shoulder Flexion  123 Degrees    Left Shoulder ABduction  136 Degrees    Left Shoulder Internal Rotation  65 Degrees    Left Shoulder External Rotation  67 Degrees    Cervical Flexion  WNL    Cervical Extension  WNL    Cervical - Right Side Bend  WNL    Cervical - Left Side Bend  WNL    Cervical - Right Rotation  WNL     Cervical - Left Rotation  WNL      Strength   Overall Strength  Within functional limits for tasks performed        LYMPHEDEMA/ONCOLOGY QUESTIONNAIRE - 05/02/18 1441      Type   Cancer Type  Right breast      Lymphedema Assessments   Lymphedema Assessments  Upper extremities      Right Upper Extremity Lymphedema   10 cm Proximal to Olecranon Process  23.6 cm    Olecranon Process  22.3 cm    10 cm Proximal to Ulnar Styloid Process  18.8 cm    Just Proximal to Ulnar Styloid Process  14.4 cm    Across Hand at [employer]  17.9 cm    At [place] of 2nd Digit  5.7 cm      Left Upper Extremity Lymphedema   10 cm Proximal to Olecranon Process  24.9 cm    Olecranon Process  23.2 cm    10 cm Proximal to Ulnar Styloid Process  20.1 cm    Just Proximal to Ulnar Styloid Process  15.1 cm    Across Hand at [employer]  18.8 cm    At [place] of 2nd Digit  5.5 cm             Objective measurements completed on examination: See above findings.        Patient was instructed today in a home exercise program today for post op shoulder range of motion. These included active assist shoulder flexion in sitting, scapular retraction, wall walking with shoulder abduction, and hands behind head external rotation.  She was encouraged to do these twice a day, holding 3 seconds and repeating 5 times when permitted by her physician.          PT Education - 05/02/18 1442    Education Details  Lymphedema risk and post op shoulder ROM HEP    Person(s) Educated  Patient;Child(ren)    Methods  Explanation;Demonstration;Handout    Comprehension  Returned demonstration          PT Long Term Goals - 05/02/18 1447      PT LONG TERM GOAL #1  Title  Patient will demonstrate she has regained full shoulder ROM and function post operatively compared to baselines.    Time  [facility] Goals - 05/02/18 1447      Patient will be able to  verbalize understanding of pertinent lymphedema risk reduction practices relevant to her diagnosis specifically related to skin care.   Time  1    Period  Days    Status  Achieved      Patient will be able to return demonstrate and/or verbalize understanding of the post-op home exercise program related to regaining shoulder range of motion.   Time  1    Period  Days    Status  Achieved      Patient will be able to verbalize understanding of the importance of attending the postoperative After Breast Cancer Class for further lymphedema risk reduction education and therapeutic exercise.   Time  1    Period  Days    Status  Achieved            Plan - 05/02/18 1444    Clinical Impression Statement  Patient was diagnosed on 04/12/2018 with right grade I invasive ductal carcinoma breast cancer. It measures 3.6 cm and is located in the upper outer quadrant. It is ER/PR positive and HER2 negative with a Ki67 of 40%. She has a positive axillary lymph node.  She has a history of left breast cancer in 1992 and underwent a left lumpectomy, sentinel node biopsy and radiation. She has had mild lymphedema in her left arm since surgery in 1992. Her multidisciplinary medical team met prior to her assessments to determine a recommended treatment plan. She is planning to have a right lumpectomy and targeted node biopsy followed by radiation and anti-estrogen therapy. She will benefit from a post op PT visit to reassess and determine needs.    History and Personal Factors relevant to plan of care:  Lives alone; previous hx of left breast cancer; left arm lymphedema    Clinical Presentation  Stable    Clinical Decision Making  Low    Rehab Potential  Excellent    Clinical Impairments Affecting Rehab Potential  None    PT Frequency  --   Eval and 1 f/u visit   PT Treatment/Interventions  ADLs/Self Care Home Management;Therapeutic exercise;Patient/family education    PT Next Visit Plan  Will reassess 3-4 weeks  after surgery to determine needs    PT Home Exercise Plan  Post op shoulder ROM HEP    Consulted and Agree with Plan of Care  Patient;Family member/caregiver    Family Member Consulted  daughters       Patient will benefit from skilled therapeutic intervention in order to improve the following deficits and impairments:  Decreased range of motion, Pain, Decreased knowledge of precautions, Postural dysfunction, Impaired UE functional use  Visit Diagnosis: Malignant neoplasm of upper-outer quadrant of right breast in female, estrogen receptor positive ([place]) - Plan: PT plan of care cert/re-cert  Abnormal posture - Plan: PT plan of care cert/re-cert   Patient will follow up at outpatient cancer rehab 3-4 weeks following surgery.  If the patient requires physical therapy at that time, a specific plan will be dictated and sent to the referring physician for approval. The patient was educated today on appropriate basic range of motion exercises to begin post operatively and the importance of attending the After  Breast Cancer class following surgery.  Patient was educated today on lymphedema risk reduction practices as it pertains to recommendations that will benefit the patient immediately following surgery.  She verbalized good understanding.      Problem List Patient Active Problem List   Diagnosis Date Noted  . Malignant neoplasm of upper-outer quadrant of right breast in female, estrogen receptor positive ([place]) 04/27/2018  . Essential hypertension 07/15/2016  . Seasonal allergic rhinitis 07/15/2016  . Mild hyperlipidemia 07/15/2016   Annia Friendly, PT 05/02/18 2:51 PM  [place], [place], [address] Phone: [phone]   Fax:  [phone]  Name: Kristina Bridges MRN: [id number] Date of Birth: 1940-10-17

## 2018-05-03 ENCOUNTER — Telehealth: Payer: Self-pay | Admitting: Hematology

## 2018-05-03 NOTE — Telephone Encounter (Signed)
No los per 01/22.

## 2018-05-04 ENCOUNTER — Encounter: Payer: Self-pay | Admitting: Genetics

## 2018-05-04 DIAGNOSIS — Z8042 Family history of malignant neoplasm of prostate: Secondary | ICD-10-CM | POA: Insufficient documentation

## 2018-05-04 DIAGNOSIS — Z8049 Family history of malignant neoplasm of other genital organs: Secondary | ICD-10-CM | POA: Insufficient documentation

## 2018-05-04 DIAGNOSIS — Z1379 Encounter for other screening for genetic and chromosomal anomalies: Secondary | ICD-10-CM | POA: Insufficient documentation

## 2018-05-04 DIAGNOSIS — Z806 Family history of leukemia: Secondary | ICD-10-CM | POA: Insufficient documentation

## 2018-05-04 DIAGNOSIS — Z800 Family history of malignant neoplasm of digestive organs: Secondary | ICD-10-CM | POA: Insufficient documentation

## 2018-05-04 NOTE — Progress Notes (Signed)
REFERRING PROVIDER: Truitt Merle, MD [place], [place] [address]  PRIMARY PROVIDER:  Lucretia Kern, DO  PRIMARY REASON FOR VISIT:  1. Malignant neoplasm of upper-outer quadrant of right breast in female, estrogen receptor positive ([place])   2. Family history of prostate cancer   3. Family history of cervical cancer   4. Family history of throat cancer   5. Family history of leukemia     HISTORY OF PRESENT ILLNESS:   Kristina Bridges, a 78 y.o. female, was seen for a  cancer genetics consultation at the request of Dr. Burr Medico due to a personal and family history of cancer.  Kristina Bridges presents to clinic today to discuss the possibility of a hereditary predisposition to cancer, genetic testing, and to further clarify her future cancer risks, as well as potential cancer risks for family members.   Kristina Bridges has a history of left breast cancer that was dx at the age of 71.  Recently in Jan 2020, at the age of 70, Kristina Bridges was diagnosed with contralateral (right sided) breast cancer.   CANCER HISTORY:    Malignant neoplasm of upper-outer quadrant of right breast in female, estrogen receptor positive ([place])   04/18/2018 Mammogram    Diagnostic [place] 04/18/18  IMPRESSION: Suspicious mass measuring 2.4 x 1.3 centimeters in the retroareolar region of the RIGHT breast  Enlarged RIGHT axillary lymph node.    04/20/2018 Initial Biopsy    Diagnosis 04/20/18 1. Breast, right, needle core biopsy, 11 o'clock - INVASIVE DUCTAL CARCINOMA. - SEE COMMENT. 2. Lymph node, needle/core biopsy, right axilla - METASTATIC CARCINOMA IN 1 OF 1 LYMPH NODE (1/1). - SEE COMMENT. Microscopic Comment 1. The carcinoma appears grade I. A breast prognostic profile will be performed and the results reported separately. 2. The carcinoma in part 2 is somewhat morphologically dissimilar from that seen in part 1. A breast prognostic profile will also be performed on part 2 and the results  reported separately. The results were called to [facility] on 04/23/2018. (JBK:ecj 04/23/2018)    04/20/2018 Receptors her2    IMMUNOHISTOCHEMICAL AND MORPHOMETRIC ANALYSIS PERFORMED MANUALLY The tumor cells are NEGATIVE for Her2 (1+). Estrogen Receptor: 100%, POSITIVE, STRONG STAINING INTENSITY Progesterone Receptor: 50%, POSITIVE, STRONG STAINING INTENSITY Proliferation Marker Ki67: 40%    04/27/2018 Initial Diagnosis    Malignant neoplasm of upper-outer quadrant of right breast in female, estrogen receptor positive ([place])      HORMONAL RISK FACTORS:  Menarche was at age 62.  First live birth at age 24.  OCP use for approximately 0 years.  Ovaries intact: yes.  Hysterectomy: no.  Menopausal status: postmenopausal.  HRT use: 0 years. Colonoscopy: no; not examined.  Past Medical History:  Diagnosis Date  . Breast cancer ([place])    remote, tx with surgery and radiation - no recurrence  . Chicken pox   . Family history of cervical cancer   . Family history of leukemia   . Family history of prostate cancer   . Family history of throat cancer   . Hypertension   . Personal history of radiation therapy   . Seasonal allergies     Past Surgical History:  Procedure Laterality Date  . BREAST BIOPSY  1992  . BREAST LUMPECTOMY Left     Social History   Socioeconomic History  . Marital status: Divorced    Spouse name: Not on file  . Number of children: Not on file  . Years of education:  Not on file  . Highest education level: Not on file  Occupational History  . Not on file  Social Needs  . Financial resource strain: Not on file  . Food insecurity:    Worry: Not on file    Inability: Not on file  . Transportation needs:    Medical: Not on file    Non-medical: Not on file  Tobacco Use  . Smoking status: Never Smoker  . Smokeless tobacco: Never Used  Substance and Sexual Activity  . Alcohol use: No  . Drug use: No  . Sexual activity: Not on file   Lifestyle  . Physical activity:    Days per week: Not on file    Minutes per session: Not on file  . Stress: Not on file  Relationships  . Social connections:    Talks on phone: Not on file    Gets together: Not on file    Attends religious service: Not on file    Active member of club or organization: Not on file    Attends meetings of clubs or organizations: Not on file    Relationship status: Not on file  Other Topics Concern  . Not on file  Social History Narrative   Work or School: retired, used to work in [employer] Situation: lives alone - raised a grandson but he has moved out, he comes to visit, has a lot of good friends - two daughters sylvia and zena with health issues      Spiritual Beliefs: Christian, haas good support      Lifestyle: exercises every day and eat healthy           FAMILY HISTORY:  We obtained a detailed, 4-generation family history.  Significant diagnoses are listed below: Family History  Problem Relation Age of Onset  . Hypertension Other   . Hypertension Mother   . Cervical cancer Sister   . Cancer Brother        throat cancer   . Prostate cancer Brother   . Leukemia Daughter        CML  . Cervical cancer Daughter   . Prostate cancer Son   . Cervical cancer Sister     Kristina Bridges has 2 daughters and 1 son. 1 daughter is 1 and has Leukemia. 1 daughter is 73 and has a hx of cervical cancer.  Her son has advanced prostate cancer.   Kristina Bridges has 5 brothers and 3 sisters: -1 sister dx with cervical cancer -1 sister dx with cervical cancer -1 brother was dx with prostate cancer -1 brother was dx with throat cancer -2 brothers with no hx of cancer -1 sister with no hx of cancer.  Kristina Bridges's father: died at 90 with no hxof cancer.  Paternal aunts/Uncles: several paternal aunts/unclse, no known cancer dx, but patient does not know a lot about these relatives' health.  Paternal cousins: no known cancer.  Paternal  grandfather: died in his 37's with no hx of cancer.  Paternal grandmother:died in her 73's with no hx of cancer.   Kristina Bridges mother: died at 109, no hx of cancer.  Maternal Aunts/Uncles: many aunts/uncles, no known hx of cancer.  Maternal cousins: no known hx of cancer.  Maternal grandfather: unk Maternal grandmother:unk  Ms. Degroat is unaware of previous family history of genetic testing for hereditary cancer risks. Patient's ancestors are of Black/African American, [place], European descent, There is no reported Ashkenazi Jewish ancestry. There  is no known consanguinity.  GENETIC COUNSELING ASSESSMENT: Jayleen Scaglione is a 78 y.o. female with a personal and family history which is somewhat suggestive of a Hereditary Cancer Predisposition Syndrome. We, therefore, discussed and recommended the following at today's visit.   DISCUSSION: We reviewed the characteristics, features and inheritance patterns of hereditary cancer syndromes. We also discussed genetic testing, including the appropriate family members to test, the process of testing, insurance coverage and turn-around-time for results. We discussed the implications of a negative, positive and/or variant of uncertain significant result. We recommended Ms. Bifulco pursue genetic testing for the Common Hereditary Cancers Panel.   The Multi-Cancer Panel offered by [facility] includes sequencing and/or deletion duplication testing of the following 90 genes: AIP, ALK, APC, ATM, AXIN2, BAP1, BARD1, BLM, BMPR1A, BRCA1, BRCA2, BRIP1, BUB1B, CASR, CDC73, CDH1, CDK4, CDKN1B, CDKN1C, CDKN2A, CEBPA, CHEK2, CTNNA1, DICER1, DIS3L2, EGFR, ENG, EPCAM, FH, FLCN, GALNT12, GATA2, GPC3, GREM1, HOXB13, HRAS, KIT, MAX, MEN1, MET, MITF, MLH1, MLH3, MSH2, MSH3, MSH6, MUTYH, NBN, NF1, NF2, NTHL1, PALB2, PDGFRA, PHOX2B, PMS2, POLD1, POLE, POT1, PRKAR1A, PTCH1, PTEN, RAD50, RAD51C, RAD51D, RB1, RECQL4, RET, RNF43, RPS20, RUNX1, SDHA, SDHAF2, SDHB, SDHC, SDHD,  SMAD4, SMARCA4, SMARCB1, SMARCE1, STK11, SUFU, TERC, TERT, TMEM127, TP53, TSC1, TSC2, VHL, WRN, WT1  We discussed that only 5-10% of cancers are associated with a Hereditary cancer predisposition syndrome.  One of the most common hereditary cancer syndromes that increases breast cancer risk is called Hereditary Breast and Ovarian Cancer (HBOC) syndrome.  This syndrome is caused by mutations in the BRCA1 and BRCA2 genes.  This syndrome increases an individual's lifetime risk to develop breast, ovarian, pancreatic, and other types of cancer.  There are also many other cancer predisposition syndromes caused by mutations in several other genes.  We discussed that if she is found to have a mutation in one of these genes, it may impact surgical decisions, and alter future medical management recommendations such as increased cancer screenings and consideration of risk reducing surgeries.  A positive result could also have implications for the patient's family members.  A Negative result would mean we were unable to identify a hereditary component to her cancer, but does not rule out the possibility of a hereditary basis for her cancer.  There could be mutations that are undetectable by current technology, or in genes not yet tested or identified to increase cancer risk.    We discussed the potential to find a Variant of Uncertain Significance or VUS.  These are variants that have not yet been identified as pathogenic or benign, and it is unknown if this variant is associated with increased cancer risk or if this is a normal finding.  Most VUS's are reclassified to benign or likely benign.   It should not be used to make medical management decisions. With time, we suspect the lab will determine the significance of any VUS's identified if any.   Based on Ms. Siegrist's personal and family history of cancer, she meets medical criteria for genetic testing. Despite that she meets criteria, she may still have an out of  pocket cost. The laboratory can provide her with an estimate of her OOP cost. she was given the contact information of the laboratory if she has further questions.   PLAN: Despite our recommendation, Ms. Waren did not wish to pursue genetic testing at today's visit. We understand this decision, and remain available to coordinate genetic testing at any time in the future. We; therefore, recommend Ms. Mckellar continue to follow the  cancer screening guidelines given by her primary healthcare provider.  Based on Ms. Tietze's family history, we recommended her relatives affected with cancer have genetic counseling and testing. Ms. Ghazarian will let us know if we can be of any assistance in coordinating genetic counseling and/or testing for this family member.   Lastly, we encouraged Ms. Brake to remain in contact with cancer genetics annually so that we can continuously update the family history and inform her of any changes in cancer genetics and testing that may be of benefit for this family.   Ms.  Panchal questions were answered to her satisfaction today. Our contact information was provided should additional questions or concerns arise. Thank you for the referral and allowing [place] to share in the care of your patient.   Tana Felts, MS, [facility] Certified Genetic Counselor [email or web address] phone: [phone]  The patient was seen for a total of 20 minutes in face-to-face genetic counseling.  The patient was accompanied today by her 2 daughters [place] and [place].  This patient was discussed with Drs. Magrinat, Lindi Adie and/or Burr Medico who agrees with the above.

## 2018-05-07 ENCOUNTER — Other Ambulatory Visit: Payer: Self-pay | Admitting: General Surgery

## 2018-05-07 DIAGNOSIS — C50111 Malignant neoplasm of central portion of right female breast: Secondary | ICD-10-CM

## 2018-05-07 DIAGNOSIS — Z170 Estrogen receptor positive status [ER+]: Principal | ICD-10-CM

## 2018-05-08 ENCOUNTER — Telehealth: Payer: Self-pay | Admitting: *Deleted

## 2018-05-08 NOTE — Telephone Encounter (Signed)
Called pt to discuss [place] from 1.22.20. Unable to leave vm d/t mailbox full.

## 2018-05-09 ENCOUNTER — Other Ambulatory Visit: Payer: Self-pay

## 2018-05-09 ENCOUNTER — Encounter (HOSPITAL_BASED_OUTPATIENT_CLINIC_OR_DEPARTMENT_OTHER): Payer: Self-pay | Admitting: *Deleted

## 2018-05-10 ENCOUNTER — Telehealth: Payer: Self-pay | Admitting: Hematology

## 2018-05-10 NOTE — Telephone Encounter (Signed)
Unable to reach patient per sch message - unable to leave message - sent reminder letter in the mail with appt date and time

## 2018-05-11 ENCOUNTER — Encounter (HOSPITAL_BASED_OUTPATIENT_CLINIC_OR_DEPARTMENT_OTHER)
Admission: RE | Admit: 2018-05-11 | Discharge: 2018-05-11 | Disposition: A | Discharge status: Home / Self Care | Payer: Medicare Other | Source: Ambulatory Visit | Attending: General Surgery | Admitting: General Surgery

## 2018-05-11 DIAGNOSIS — I10 Essential (primary) hypertension: Secondary | ICD-10-CM | POA: Insufficient documentation

## 2018-05-11 MED ORDER — ENSURE PRE-SURGERY PO LIQD: 296.0000 mL | Freq: Once | ORAL | Status: DC

## 2018-05-11 NOTE — Progress Notes (Signed)
Ensure pre surgery drink given with instructions to complete by 0400 dos, surgical soap given with instructions, pt verbalized understanding.  EKG reviewed by Dr. Kalman Shan, will proceed with surgery as scheduled.

## 2018-05-14 ENCOUNTER — Ambulatory Visit
Admission: RE | Admit: 2018-05-14 | Discharge: 2018-05-14 | Disposition: A | Discharge status: Home / Self Care | Payer: Medicare Other | Source: Ambulatory Visit | Attending: General Surgery | Admitting: General Surgery

## 2018-05-14 ENCOUNTER — Other Ambulatory Visit: Payer: Self-pay | Admitting: General Surgery

## 2018-05-14 DIAGNOSIS — C50111 Malignant neoplasm of central portion of right female breast: Secondary | ICD-10-CM

## 2018-05-14 DIAGNOSIS — Z170 Estrogen receptor positive status [ER+]: Principal | ICD-10-CM

## 2018-05-14 DIAGNOSIS — R590 Localized enlarged lymph nodes: Secondary | ICD-10-CM | POA: Diagnosis not present

## 2018-05-14 DIAGNOSIS — C50411 Malignant neoplasm of upper-outer quadrant of right female breast: Secondary | ICD-10-CM | POA: Diagnosis not present

## 2018-05-14 NOTE — Anesthesia Preprocedure Evaluation (Addendum)
Anesthesia Evaluation  Patient identified by MRN, date of birth, ID band Patient awake    Reviewed: Allergy & Precautions, NPO status , Patient's Chart, lab work & pertinent test results  History of Anesthesia Complications Negative for: history of anesthetic complications  Airway Mallampati: II  TM Distance: >3 FB Neck ROM: Full    Dental  (+) Dental Advisory Given, Upper Dentures, Partial Lower   Pulmonary neg pulmonary ROS,    Pulmonary exam normal breath sounds clear to auscultation       Cardiovascular hypertension, Pt. on medications Normal cardiovascular exam Rhythm:Regular Rate:Normal     Neuro/Psych negative neurological ROS     GI/Hepatic negative GI ROS, Neg liver ROS,   Endo/Other  negative endocrine ROS  Renal/GU negative Renal ROS     Musculoskeletal negative musculoskeletal ROS (+)   Abdominal   Peds  Hematology negative hematology ROS (+)   Anesthesia Other Findings Breast ca  Reproductive/Obstetrics                           Anesthesia Physical Anesthesia Plan  ASA: II  Anesthesia Plan: General   Post-op Pain Management: GA combined w/ Regional for post-op pain   Induction: Intravenous  PONV Risk Score and Plan: 3 and Treatment may vary due to age or medical condition, Ondansetron and Dexamethasone  Airway Management Planned: LMA  Additional Equipment:   Intra-op Plan:   Post-operative Plan: Extubation in OR  Informed Consent: I have reviewed the patients History and Physical, chart, labs and discussed the procedure including the risks, benefits and alternatives for the proposed anesthesia with the patient or authorized representative who has indicated his/her understanding and acceptance.     Dental advisory given  Plan Discussed with: CRNA  Anesthesia Plan Comments:       Anesthesia Quick Evaluation

## 2018-05-15 ENCOUNTER — Ambulatory Visit (HOSPITAL_COMMUNITY)
Admission: RE | Admit: 2018-05-15 | Discharge: 2018-05-15 | Disposition: A | Discharge status: Home / Self Care | Payer: Medicare Other | Source: Ambulatory Visit | Attending: General Surgery | Admitting: General Surgery

## 2018-05-15 ENCOUNTER — Encounter (HOSPITAL_BASED_OUTPATIENT_CLINIC_OR_DEPARTMENT_OTHER)
Admission: RE | Disposition: A | Discharge status: Home / Self Care | Payer: Self-pay | Source: Home / Self Care | Attending: General Surgery

## 2018-05-15 ENCOUNTER — Ambulatory Visit
Admission: RE | Admit: 2018-05-15 | Discharge: 2018-05-15 | Disposition: A | Discharge status: Home / Self Care | Payer: Medicare Other | Source: Ambulatory Visit | Attending: General Surgery | Admitting: General Surgery

## 2018-05-15 ENCOUNTER — Encounter (HOSPITAL_BASED_OUTPATIENT_CLINIC_OR_DEPARTMENT_OTHER): Payer: Self-pay | Admitting: Certified Registered"

## 2018-05-15 ENCOUNTER — Ambulatory Visit (HOSPITAL_BASED_OUTPATIENT_CLINIC_OR_DEPARTMENT_OTHER)
Admission: RE | Admit: 2018-05-15 | Discharge: 2018-05-15 | Disposition: A | Discharge status: Home / Self Care | Payer: Medicare Other | Attending: General Surgery | Admitting: General Surgery

## 2018-05-15 ENCOUNTER — Ambulatory Visit (HOSPITAL_BASED_OUTPATIENT_CLINIC_OR_DEPARTMENT_OTHER): Payer: Medicare Other | Admitting: Anesthesiology

## 2018-05-15 DIAGNOSIS — Z79899 Other long term (current) drug therapy: Secondary | ICD-10-CM | POA: Diagnosis not present

## 2018-05-15 DIAGNOSIS — Z923 Personal history of irradiation: Secondary | ICD-10-CM | POA: Diagnosis not present

## 2018-05-15 DIAGNOSIS — Z170 Estrogen receptor positive status [ER+]: Secondary | ICD-10-CM | POA: Diagnosis not present

## 2018-05-15 DIAGNOSIS — C50111 Malignant neoplasm of central portion of right female breast: Secondary | ICD-10-CM | POA: Insufficient documentation

## 2018-05-15 DIAGNOSIS — R590 Localized enlarged lymph nodes: Secondary | ICD-10-CM | POA: Diagnosis not present

## 2018-05-15 DIAGNOSIS — Z853 Personal history of malignant neoplasm of breast: Secondary | ICD-10-CM | POA: Diagnosis not present

## 2018-05-15 DIAGNOSIS — C50912 Malignant neoplasm of unspecified site of left female breast: Secondary | ICD-10-CM | POA: Diagnosis not present

## 2018-05-15 DIAGNOSIS — E785 Hyperlipidemia, unspecified: Secondary | ICD-10-CM | POA: Diagnosis not present

## 2018-05-15 DIAGNOSIS — C773 Secondary and unspecified malignant neoplasm of axilla and upper limb lymph nodes: Secondary | ICD-10-CM | POA: Diagnosis not present

## 2018-05-15 DIAGNOSIS — G8918 Other acute postprocedural pain: Secondary | ICD-10-CM | POA: Diagnosis not present

## 2018-05-15 DIAGNOSIS — C50411 Malignant neoplasm of upper-outer quadrant of right female breast: Secondary | ICD-10-CM | POA: Diagnosis not present

## 2018-05-15 DIAGNOSIS — I10 Essential (primary) hypertension: Secondary | ICD-10-CM | POA: Insufficient documentation

## 2018-05-15 DIAGNOSIS — C50911 Malignant neoplasm of unspecified site of right female breast: Secondary | ICD-10-CM | POA: Diagnosis not present

## 2018-05-15 HISTORY — PX: BREAST LUMPECTOMY WITH RADIOACTIVE SEED AND SENTINEL LYMPH NODE BIOPSY: SHX6550

## 2018-05-15 SURGERY — BREAST LUMPECTOMY WITH RADIOACTIVE SEED AND SENTINEL LYMPH NODE BIOPSY
Anesthesia: General | Site: Breast | Laterality: Right

## 2018-05-15 MED ORDER — OXYCODONE HCL 5 MG PO TABS: 5.0000 mg | ORAL_TABLET | Freq: Once | ORAL | Status: DC | PRN

## 2018-05-15 MED ORDER — LIDOCAINE-EPINEPHRINE (PF) 1 %-1:200000 IJ SOLN
INTRAMUSCULAR | Status: DC | PRN
  Administered 2018-05-15: 26 mL via INTRAMUSCULAR
  Administered 2018-05-15: 10 mL via INTRAMUSCULAR

## 2018-05-15 MED ORDER — BUPIVACAINE-EPINEPHRINE (PF) 0.5% -1:200000 IJ SOLN
INTRAMUSCULAR | Status: DC | PRN
  Administered 2018-05-15: 22 mL via PERINEURAL

## 2018-05-15 MED ORDER — ACETAMINOPHEN 500 MG PO TABS
ORAL_TABLET | ORAL | Status: AC
  Filled 2018-05-15: qty 2

## 2018-05-15 MED ORDER — OXYCODONE HCL 5 MG PO TABS: 5.0000 mg | ORAL_TABLET | Freq: Four times a day (QID) | ORAL | 0 refills | Status: DC | PRN

## 2018-05-15 MED ORDER — OXYCODONE HCL 5 MG/5ML PO SOLN: 5.0000 mg | Freq: Once | ORAL | Status: DC | PRN

## 2018-05-15 MED ORDER — LIDOCAINE HCL (CARDIAC) PF 100 MG/5ML IV SOSY
PREFILLED_SYRINGE | INTRAVENOUS | Status: DC | PRN
  Administered 2018-05-15: 30 mg via INTRAVENOUS

## 2018-05-15 MED ORDER — FENTANYL CITRATE (PF) 100 MCG/2ML IJ SOLN
INTRAMUSCULAR | Status: AC
  Filled 2018-05-15: qty 2

## 2018-05-15 MED ORDER — SCOPOLAMINE 1 MG/3DAYS TD PT72: 1.0000 | MEDICATED_PATCH | TRANSDERMAL | Status: DC

## 2018-05-15 MED ORDER — DEXAMETHASONE SODIUM PHOSPHATE 4 MG/ML IJ SOLN
INTRAMUSCULAR | Status: DC | PRN
  Administered 2018-05-15: 6 mg via INTRAVENOUS

## 2018-05-15 MED ORDER — PROMETHAZINE HCL 25 MG/ML IJ SOLN: 6.2500 mg | INTRAMUSCULAR | Status: DC | PRN

## 2018-05-15 MED ORDER — PHENYLEPHRINE HCL 10 MG/ML IJ SOLN
INTRAMUSCULAR | Status: DC | PRN
  Administered 2018-05-15: 40 ug via INTRAVENOUS

## 2018-05-15 MED ORDER — LIDOCAINE-EPINEPHRINE 1 %-1:100000 IJ SOLN
INTRAMUSCULAR | Status: AC
  Filled 2018-05-15: qty 1

## 2018-05-15 MED ORDER — MIDAZOLAM HCL 2 MG/2ML IJ SOLN
INTRAMUSCULAR | Status: AC
  Filled 2018-05-15: qty 2

## 2018-05-15 MED ORDER — METHYLENE BLUE 0.5 % INJ SOLN
INTRAVENOUS | Status: AC
  Filled 2018-05-15: qty 10

## 2018-05-15 MED ORDER — SCOPOLAMINE 1 MG/3DAYS TD PT72: 1.0000 | MEDICATED_PATCH | Freq: Once | TRANSDERMAL | Status: DC | PRN

## 2018-05-15 MED ORDER — GABAPENTIN 300 MG PO CAPS
300.0000 mg | ORAL_CAPSULE | ORAL | Status: AC
  Administered 2018-05-15: 300 mg via ORAL

## 2018-05-15 MED ORDER — BUPIVACAINE HCL (PF) 0.25 % IJ SOLN
INTRAMUSCULAR | Status: AC
  Filled 2018-05-15: qty 30

## 2018-05-15 MED ORDER — CHLORHEXIDINE GLUCONATE CLOTH 2 % EX PADS: 6.0000 | MEDICATED_PAD | Freq: Once | CUTANEOUS | Status: DC

## 2018-05-15 MED ORDER — LACTATED RINGERS IV SOLN
INTRAVENOUS | Status: DC
  Administered 2018-05-15 (×2): via INTRAVENOUS

## 2018-05-15 MED ORDER — FENTANYL CITRATE (PF) 100 MCG/2ML IJ SOLN
50.0000 ug | INTRAMUSCULAR | Status: DC | PRN
  Administered 2018-05-15: 50 ug via INTRAVENOUS
  Administered 2018-05-15: 25 ug via INTRAVENOUS

## 2018-05-15 MED ORDER — ONDANSETRON HCL 4 MG/2ML IJ SOLN
INTRAMUSCULAR | Status: DC | PRN
  Administered 2018-05-15: 4 mg via INTRAVENOUS

## 2018-05-15 MED ORDER — PROPOFOL 10 MG/ML IV BOLUS
INTRAVENOUS | Status: DC | PRN
  Administered 2018-05-15: 100 mg via INTRAVENOUS

## 2018-05-15 MED ORDER — ACETAMINOPHEN 500 MG PO TABS
1000.0000 mg | ORAL_TABLET | ORAL | Status: AC
  Administered 2018-05-15: 1000 mg via ORAL

## 2018-05-15 MED ORDER — LIDOCAINE-EPINEPHRINE (PF) 1 %-1:200000 IJ SOLN
INTRAMUSCULAR | Status: AC
  Filled 2018-05-15: qty 30

## 2018-05-15 MED ORDER — CEFAZOLIN SODIUM-DEXTROSE 2-4 GM/100ML-% IV SOLN
2.0000 g | INTRAVENOUS | Status: AC
  Administered 2018-05-15: 2 g via INTRAVENOUS

## 2018-05-15 MED ORDER — FENTANYL CITRATE (PF) 100 MCG/2ML IJ SOLN: 25.0000 ug | INTRAMUSCULAR | Status: DC | PRN

## 2018-05-15 MED ORDER — MIDAZOLAM HCL 2 MG/2ML IJ SOLN
1.0000 mg | INTRAMUSCULAR | Status: DC | PRN
  Administered 2018-05-15: 1 mg via INTRAVENOUS

## 2018-05-15 MED ORDER — GABAPENTIN 300 MG PO CAPS
ORAL_CAPSULE | ORAL | Status: AC
  Filled 2018-05-15: qty 1

## 2018-05-15 MED ORDER — CEFAZOLIN SODIUM-DEXTROSE 2-4 GM/100ML-% IV SOLN
INTRAVENOUS | Status: AC
  Filled 2018-05-15: qty 100

## 2018-05-15 MED ORDER — TECHNETIUM TC 99M SULFUR COLLOID FILTERED
1.0000 | Freq: Once | INTRAVENOUS | Status: AC | PRN
  Administered 2018-05-15: 1 via INTRADERMAL

## 2018-05-15 SURGICAL SUPPLY — 65 items
ADH SKN CLS APL DERMABOND .7 (GAUZE/BANDAGES/DRESSINGS) ×1
BINDER BREAST LRG (GAUZE/BANDAGES/DRESSINGS) IMPLANT
BINDER BREAST MEDIUM (GAUZE/BANDAGES/DRESSINGS) IMPLANT
BINDER BREAST XLRG (GAUZE/BANDAGES/DRESSINGS) IMPLANT
BINDER BREAST XXLRG (GAUZE/BANDAGES/DRESSINGS) IMPLANT
BLADE SURG 10 STRL SS (BLADE) ×3 IMPLANT
BLADE SURG 15 STRL LF DISP TIS (BLADE) ×1 IMPLANT
BLADE SURG 15 STRL SS (BLADE) ×3
BNDG COHESIVE 4X5 TAN STRL (GAUZE/BANDAGES/DRESSINGS) ×3 IMPLANT
CANISTER SUC SOCK COL 7IN (MISCELLANEOUS) IMPLANT
CANISTER SUCT 1200ML W/VALVE (MISCELLANEOUS) ×3 IMPLANT
CHLORAPREP W/TINT 26ML (MISCELLANEOUS) ×3 IMPLANT
CLIP VESOCCLUDE LG 6/CT (CLIP) ×3 IMPLANT
CLIP VESOCCLUDE MED 6/CT (CLIP) ×6 IMPLANT
CLIP VESOCCLUDE SM WIDE 6/CT (CLIP) IMPLANT
CLOSURE WOUND 1/2 X4 (GAUZE/BANDAGES/DRESSINGS) ×1
COVER MAYO STAND STRL (DRAPES) ×6 IMPLANT
COVER PROBE W GEL 5X96 (DRAPES) ×3 IMPLANT
COVER WAND RF STERILE (DRAPES) IMPLANT
DECANTER SPIKE VIAL GLASS SM (MISCELLANEOUS) ×2 IMPLANT
DERMABOND ADVANCED (GAUZE/BANDAGES/DRESSINGS) ×2
DERMABOND ADVANCED .7 DNX12 (GAUZE/BANDAGES/DRESSINGS) ×1 IMPLANT
DRAPE UTILITY XL STRL (DRAPES) ×3 IMPLANT
DRSG PAD ABDOMINAL 8X10 ST (GAUZE/BANDAGES/DRESSINGS) ×3 IMPLANT
ELECT COATED BLADE 2.86 ST (ELECTRODE) ×3 IMPLANT
ELECT REM PT RETURN 9FT ADLT (ELECTROSURGICAL) ×3
ELECTRODE REM PT RTRN 9FT ADLT (ELECTROSURGICAL) ×1 IMPLANT
GAUZE SPONGE 4X4 12PLY STRL LF (GAUZE/BANDAGES/DRESSINGS) ×3 IMPLANT
GLOVE BIO SURGEON STRL SZ 6 (GLOVE) ×3 IMPLANT
GLOVE BIOGEL PI IND STRL 6.5 (GLOVE) ×1 IMPLANT
GLOVE BIOGEL PI INDICATOR 6.5 (GLOVE) ×2
GOWN STRL REUS W/ TWL LRG LVL3 (GOWN DISPOSABLE) ×1 IMPLANT
GOWN STRL REUS W/TWL 2XL LVL3 (GOWN DISPOSABLE) ×3 IMPLANT
GOWN STRL REUS W/TWL LRG LVL3 (GOWN DISPOSABLE) ×3
KIT MARKER MARGIN INK (KITS) ×3 IMPLANT
LIGHT WAVEGUIDE WIDE FLAT (MISCELLANEOUS) IMPLANT
NDL HYPO 25X1 1.5 SAFETY (NEEDLE) ×1 IMPLANT
NDL SAFETY ECLIPSE 18X1.5 (NEEDLE) ×1 IMPLANT
NEEDLE HYPO 18GX1.5 SHARP (NEEDLE) ×3
NEEDLE HYPO 25X1 1.5 SAFETY (NEEDLE) ×3 IMPLANT
NS IRRIG 1000ML POUR BTL (IV SOLUTION) ×3 IMPLANT
PACK BASIN DAY SURGERY FS (CUSTOM PROCEDURE TRAY) ×3 IMPLANT
PACK UNIVERSAL I (CUSTOM PROCEDURE TRAY) ×3 IMPLANT
PENCIL BUTTON HOLSTER BLD 10FT (ELECTRODE) ×3 IMPLANT
SLEEVE SCD COMPRESS KNEE MED (MISCELLANEOUS) ×3 IMPLANT
SPONGE LAP 18X18 RF (DISPOSABLE) ×6 IMPLANT
STAPLER VISISTAT 35W (STAPLE) IMPLANT
STOCKINETTE IMPERVIOUS LG (DRAPES) ×3 IMPLANT
STRIP CLOSURE SKIN 1/2X4 (GAUZE/BANDAGES/DRESSINGS) ×2 IMPLANT
SUT ETHILON 2 0 FS 18 (SUTURE) IMPLANT
SUT MNCRL AB 4-0 PS2 18 (SUTURE) ×3 IMPLANT
SUT MON AB 5-0 PS2 18 (SUTURE) IMPLANT
SUT SILK 2 0 SH (SUTURE) IMPLANT
SUT VIC AB 2-0 SH 27 (SUTURE) ×3
SUT VIC AB 2-0 SH 27XBRD (SUTURE) ×1 IMPLANT
SUT VIC AB 3-0 SH 27 (SUTURE) ×3
SUT VIC AB 3-0 SH 27X BRD (SUTURE) ×1 IMPLANT
SUT VICRYL 3-0 CR8 SH (SUTURE) ×3 IMPLANT
SYR BULB 3OZ (MISCELLANEOUS) ×3 IMPLANT
SYR CONTROL 10ML LL (SYRINGE) ×3 IMPLANT
TOWEL GREEN STERILE FF (TOWEL DISPOSABLE) ×3 IMPLANT
TRAY FAXITRON CT DISP (TRAY / TRAY PROCEDURE) ×3 IMPLANT
TUBE CONNECTING 20'X1/4 (TUBING) ×1
TUBE CONNECTING 20X1/4 (TUBING) ×2 IMPLANT
YANKAUER SUCT BULB TIP NO VENT (SUCTIONS) ×3 IMPLANT

## 2018-05-15 NOTE — Discharge Instructions (Addendum)
Post Anesthesia Home Care Instructions  Activity: Get plenty of rest for the remainder of the day. A responsible individual must stay with you for 24 hours following the procedure.  For the next 24 hours, DO NOT: -Drive a car -Paediatric nurse -Drink alcoholic beverages -Take any medication unless instructed by your physician -Make any legal decisions or sign important papers.  Meals: Start with liquid foods such as gelatin or soup. Progress to regular foods as tolerated. Avoid greasy, spicy, heavy foods. If nausea and/or vomiting occur, drink only clear liquids until the nausea and/or vomiting subsides. Call your physician if vomiting continues.  Special Instructions/Symptoms: Your throat may feel dry or sore from the anesthesia or the breathing tube placed in your throat during surgery. If this causes discomfort, gargle with warm salt water. The discomfort should disappear within 24 hours.  If you had a scopolamine patch placed behind your ear for the management of post- operative nausea and/or vomiting:  1. The medication in the patch is effective for 72 hours, after which it should be removed.  Wrap patch in a tissue and discard in the trash. Wash hands thoroughly with soap and water. 2. You may remove the patch earlier than 72 hours if you experience unpleasant side effects which may include dry mouth, dizziness or visual disturbances. 3. Avoid touching the patch. Wash your hands with soap and water after contact with the patch.      Regional Anesthesia Blocks  1. Numbness or the inability to move the "blocked" extremity may last from 3-48 hours after placement. The length of time depends on the medication injected and your individual response to the medication. If the numbness is not going away after 48 hours, call your surgeon.  2. The extremity that is blocked will need to be protected until the numbness is gone and the  Strength has returned. Because you cannot feel it, you  will need to take extra care to avoid injury. Because it may be weak, you may have difficulty moving it or using it. You may not know what position it is in without looking at it while the block is in effect.  3. For blocks in the legs and feet, returning to weight bearing and walking needs to be done carefully. You will need to wait until the numbness is entirely gone and the strength has returned. You should be able to move your leg and foot normally before you try and bear weight or walk. You will need someone to be with you when you first try to ensure you do not fall and possibly risk injury.  4. Bruising and tenderness at the needle site are common side effects and will resolve in a few days.  5. Persistent numbness or new problems with movement should be communicated to the surgeon or the [place] [phone] [place] [phone]).    [place] Office Phone Number [phone]  BREAST BIOPSY/ PARTIAL MASTECTOMY: POST OP INSTRUCTIONS  Always review your discharge instruction sheet given to you by the facility where your surgery was performed.  IF YOU HAVE DISABILITY OR FAMILY LEAVE FORMS, YOU MUST BRING THEM TO THE OFFICE FOR PROCESSING.  DO NOT GIVE THEM TO YOUR DOCTOR.  1. A prescription for pain medication may be given to you upon discharge.  Take your pain medication as prescribed, if needed.  If narcotic pain medicine is not needed, then you may take acetaminophen (Tylenol) or ibuprofen (Advil) as needed. 2. Take your  usually prescribed medications unless otherwise directed 3. If you need a refill on your pain medication, please contact your pharmacy.  They will contact our office to request authorization.  Prescriptions will not be filled after 5pm or on week-ends. 4. You should eat very light the first 24 hours after surgery, such as soup, crackers, pudding, etc.  Resume your normal diet the day after surgery. 5. Most patients  will experience some swelling and bruising in the breast.  Ice packs and a good support bra will help.  Swelling and bruising can take several days to resolve.  6. It is common to experience some constipation if taking pain medication after surgery.  Increasing fluid intake and taking a stool softener will usually help or prevent this problem from occurring.  A mild laxative (Milk of Magnesia or Miralax) should be taken according to package directions if there are no bowel movements after 48 hours. 7. Unless discharge instructions indicate otherwise, you may remove your bandages 48 hours after surgery, and you may shower at that time.  You may have steri-strips (small skin tapes) in place directly over the incision.  These strips should be left on the skin for 7-10 days.   Any sutures or staples will be removed at the office during your follow-up visit. 8. ACTIVITIES:  You may resume regular daily activities (gradually increasing) beginning the next day.  Wearing a good support bra or sports bra (or the breast binder) minimizes pain and swelling.  You may have sexual intercourse when it is comfortable. a. You may drive when you no longer are taking prescription pain medication, you can comfortably wear a seatbelt, and you can safely maneuver your car and apply brakes. b. RETURN TO WORK:  __________1 week_______________ 9. You should see your doctor in the office for a follow-up appointment approximately two weeks after your surgery.  Your doctors nurse will typically make your follow-up appointment when she calls you with your pathology report.  Expect your pathology report 2-3 business days after your surgery.  You may call to check if you do not hear from [place] after three days.   WHEN TO CALL YOUR DOCTOR: 1. Fever over 101.0 2. Nausea and/or vomiting. 3. Extreme swelling or bruising. 4. Continued bleeding from incision. 5. Increased pain, redness, or drainage from the incision.  The clinic staff is  available to answer your questions during regular business hours.  Please dont hesitate to call and ask to speak to one of the nurses for clinical concerns.  If you have a medical emergency, go to the nearest emergency room or call 911.  A surgeon from [facility] is always on call at the hospital.  For further questions, please visit [email or web address]

## 2018-05-15 NOTE — Transfer of Care (Signed)
Immediate Anesthesia Transfer of Care Note  Patient: Kristina Bridges  Procedure(s) Performed: RIGHT BREAST LUMPECTOMY WITH RADIOACTIVE SEED AND RIGHT RADIOACTIVE SEED TARGETED LYMPH NODE BIOPSY AND RIGHT SENTINEL LYMPH NODE BIOPSY (Right Breast)  Patient Location: PACU  Anesthesia Type:GA combined with regional for post-op pain  Level of Consciousness: drowsy and patient cooperative  Airway & Oxygen Therapy: Patient Spontanous Breathing and Patient connected to face mask oxygen  Post-op Assessment: Report given to RN and Post -op Vital signs reviewed and stable  Post vital signs: Reviewed and stable  Last Vitals:  Vitals Value Taken Time  BP    Temp    Pulse 73 05/15/2018  9:15 AM  Resp 11 05/15/2018  9:15 AM  SpO2 100 % 05/15/2018  9:15 AM  Vitals shown include unvalidated device data.  Last Pain:  Vitals:   05/15/18 0640  TempSrc: Oral  PainSc: 0-No pain         Complications: No apparent anesthesia complications

## 2018-05-15 NOTE — Interval H&P Note (Signed)
History and Physical Interval Note:  05/15/2018 7:22 AM  Kristina Bridges  has presented today for surgery, with the diagnosis of right breast cancer  The various methods of treatment have been discussed with the patient and family. After consideration of risks, benefits and other options for treatment, the patient has consented to  Procedure(s): RIGHT BREAST LUMPECTOMY WITH RADIOACTIVE SEED AND RIGHT RADIOACTIVE SEED TARGETED LYMPH NODE BIOPSY AND RIGHT SENTINEL LYMPH NODE BIOPSY (Right) as a surgical intervention .  The patient's history has been reviewed, patient examined, no change in status, stable for surgery.  I have reviewed the patient's chart and labs.  Questions were answered to the patient's satisfaction.     Stark Klein

## 2018-05-15 NOTE — Op Note (Signed)
Right Breast Radioactive seed localized lumpectomy, right seed targeted deep lymph node biopsy, and sentinel lymph node biopsy  Indications: This patient presents with history of subareolar right breast cancer, Stage IIA, cT2N1, +/+/-   Pre-operative Diagnosis: right breast cancer  Post-operative Diagnosis: Same  Surgeon: Stark Klein   Anesthesia: General endotracheal anesthesia  ASA Class: 2  Procedure Details  The patient was seen in the Holding Room. The risks, benefits, complications, treatment options, and expected outcomes were discussed with the patient. The possibilities of bleeding, infection, the need for additional procedures, failure to diagnose a condition, and creating a complication requiring transfusion or operation were discussed with the patient. The patient concurred with the proposed plan, giving informed consent.  The site of surgery properly noted/marked. The patient was taken to Operating Room # 5, identified, and the procedure verified as right breast seed localized lumpectomy, right seed localized deep lymph node biopsy, right sentinel lymph node biopsy. A Time Out was held and the above information confirmed.  The right arm, breast, and chest were prepped and draped in standard fashion. The lumpectomy was performed by creating an elliptical incision incorporated the nipple over the previously placed radioactive seed.  Dissection was carried down to around the point of maximum signal intensity. The cautery was used to perform the dissection.  Hemostasis was achieved with cautery. The edges of the cavity were marked with large clips, with one each medial, lateral, inferior and superior, and two clips posteriorly.   The specimen was inked with the margin marker paint kit.    Specimen radiography confirmed inclusion of the mammographic lesion, the clip, and the seed.  The background signal in the breast was zero.  The wound was irrigated and closed with 3-0 vicryl in layers  and 4-0 monocryl subcuticular suture.    Using a hand-held gamma probe, right axillary sentinel nodes were identified transcutaneously.  An oblique incision was created below the axillary hairline.  Dissection was carried through the clavipectoral fascia.  Three deep level 2 axillary sentinel nodes were removed.  Counts per second were highest at 450.    The background count was 25 cps.  The wound was irrigated.  Hemostasis was achieved with cautery.  The axillary incision was closed with a 3-0 vicryl deep dermal interrupted sutures and a 4-0 monocryl subcuticular closure.    Sterile dressings were applied. At the end of the operation, all sponge, instrument, and needle counts were correct.  Findings: grossly clear surgical margins and no adenopathy, posterior margin is pectoralis, anterior margin is skin.   Estimated Blood Loss:  min         Specimens: right breast lumpectomy with seed and three right axillary sentinel lymph nodes.             Complications:  None; patient tolerated the procedure well.         Disposition: PACU - hemodynamically stable.         Condition: stable

## 2018-05-15 NOTE — Anesthesia Postprocedure Evaluation (Signed)
Anesthesia Post Note  Patient: Kristina Bridges  Procedure(s) Performed: RIGHT BREAST LUMPECTOMY WITH RADIOACTIVE SEED AND RIGHT RADIOACTIVE SEED TARGETED LYMPH NODE BIOPSY AND RIGHT SENTINEL LYMPH NODE BIOPSY (Right Breast)     Patient location during evaluation: PACU Anesthesia Type: General Level of consciousness: awake and alert Pain management: pain level controlled Vital Signs Assessment: post-procedure vital signs reviewed and stable Respiratory status: spontaneous breathing, nonlabored ventilation and respiratory function stable Cardiovascular status: blood pressure returned to baseline and stable Postop Assessment: no apparent nausea or vomiting Anesthetic complications: no    Last Vitals:  Vitals:   05/15/18 0940 05/15/18 0945  BP:  (!) 160/69  Pulse: 78 76  Resp: 19 17  Temp:    SpO2: 100% 100%    Last Pain:  Vitals:   05/15/18 0943  TempSrc:   PainSc: 0-No pain                 Brennan Bailey

## 2018-05-15 NOTE — H&P (Signed)
Kristina Bridges Documented: 05/02/2018 7:26 AM Location: [facility] Patient #: [id number] DOB: 1940/05/21 Undefined / Language: Kristina Bridges / Race: Black or African American Female   History of Present Illness Stark Klein MD; 05/02/2018 8:19 PM) The patient is a 78 year old female who presents with breast cancer. Pt is a 78 yo F referred for a diagnosis of right breast caner 04/2018. She has a history of left breast cancer in 1992 when she had a lumpectomy/SLN bx and radiation. She did not receive chemotherapy.   She came for screening mammogram despite having not received one since 2003. She was seen to have central mass/distortion. Retrospectively she states that she noted her right nipple was firm. Diagnostic imaging showed a 3.6 cm mass in the retroareolar location of the right breast. U/S also showed an abnormal lymph node as well. 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 The lymph node biopsy was a bit different wtih ER+/PR- with Ki 67 of 40%.  She is a G3P3 with first child at age 46. She has not used HRT. menarche was age 40. She has never had a colonoscopy or bone density study.   Dx right mammogram/us 04/18/2018 EXAM: DIGITAL DIAGNOSTIC RIGHT MAMMOGRAM WITH CAD AND TOMO  ULTRASOUND RIGHT BREAST  COMPARISON: 04/12/2018  ACR Breast Density Category b: There are scattered areas of fibroglandular density.  FINDINGS: Additional 2-D and 3-D images are performed. These views confirm presence of mass and distortion in the immediate retroareolar region of the RIGHT breast.  Mammographic images were processed with CAD.  On physical exam, there is a firm retroareolar mass in the RIGHT breast. Mild retraction of the RIGHT nipple. I palpate a mass in the RIGHT axilla.  Targeted ultrasound is performed, showing an irregular hypoechoic mass and numerous surrounding oval masses in  the retroareolar region of the LEFT breast. Given the heterogeneity, is difficult to measure the mass but is estimated to be 3.6 x 2.2 x 2.2 centimeters.  Evaluation of the RIGHT axilla shows an enlarged lymph node with thickened cortex, measuring 2.4 x 1.3 centimeters. Other normal appearing RIGHT axillary lymph nodes are also present.  IMPRESSION: Suspicious mass in the retroareolar region of the RIGHT breast.  Enlarged RIGHT axillary lymph node.  RECOMMENDATION: Ultrasound-guided core biopsy of retroareolar mass in the RIGHT breast.  Ultrasound-guided core biopsy of enlarged RIGHT axillary lymph node.  I have discussed the findings and recommendations with the patient. Results were also provided in writing at the conclusion of the visit. If applicable, a reminder letter will be sent to the patient regarding the next appointment.  BI-RADS CATEGORY 5: Highly suggestive of malignancy.   pathology 04/20/2018 1. Breast, right, needle core biopsy, 11 o'clock - INVASIVE DUCTAL CARCINOMA, grade 1  The tumor cells are NEGATIVE for Her2 (1+). Estrogen Receptor: 100%, POSITIVE, STRONG STAINING INTENSITY Progesterone Receptor: 50%, POSITIVE, STRONG STAINING INTENSITY Proliferation Marker Ki67: 40%  2. Lymph node, needle/core biopsy, right axilla - METASTATIC CARCINOMA IN 1 OF 1 LYMPH NODE ( cells are NEGATIVE for Her2 (0). Estrogen Receptor: 100%, POSITIVE, STRONG STAINING INTENSITY Progesterone Receptor: 0%, NEGATIVE1. CBC, CMET essentially normal   Past Surgical History Kristina Pummel, Kristina Bridges; 05/02/2018 7:27 AM) Breast Biopsy  Right. Breast Mass; Local Excision  Left.  Diagnostic Studies History Kristina Pummel, Kristina Bridges; 05/02/2018 7:27 AM) Mammogram  within last year Pap Smear  1-5 years ago  Medication History Kristina Pummel, Kristina Bridges; 05/02/2018  7:27 AM) Medications Reconciled  Social History Kristina Pummel, Kristina Bridges; 05/02/2018 7:27 AM) Caffeine use  Carbonated beverages,  Coffee. No alcohol use  No drug use  Tobacco use  Never smoker.  Family History Kristina Pummel, Kristina Bridges; 05/02/2018 7:27 AM) Cancer  Brother, Family Members In General, Sister. Hypertension  Mother.  Pregnancy / Birth History Kristina Pummel, Kristina Bridges; 05/02/2018 7:27 AM) Age at menarche  19 years. Gravida  3 Maternal age  31-20 Para  3  Other Problems Kristina Pummel, Kristina Bridges; 05/02/2018 7:27 AM) Breast Cancer  High blood pressure     Review of Systems Kristina Bridges; 05/02/2018 7:27 AM) General Not Present- Appetite Loss, Chills, Fatigue, Fever, Night Sweats, Weight Gain and Weight Loss. HEENT Present- Seasonal Allergies. Not Present- Earache, Hearing Loss, Hoarseness, Nose Bleed, Oral Ulcers, Ringing in the Ears, Sinus Pain, Sore Throat, Visual Disturbances, Wears glasses/contact lenses and Yellow Eyes. Respiratory Not Present- Bloody sputum, Chronic Cough, Difficulty Breathing, Snoring and Wheezing. Breast Not Present- Breast Mass, Breast Pain, Nipple Discharge and Skin Changes. Cardiovascular Not Present- Chest Pain, Difficulty Breathing Lying Down, Leg Cramps, Palpitations, Rapid Heart Rate, Shortness of Breath and Swelling of Extremities. Gastrointestinal Not Present- Abdominal Pain, Bloating, Bloody Stool, Change in Bowel Habits, Chronic diarrhea, Constipation, Difficulty Swallowing, Excessive gas, Gets full quickly at meals, Hemorrhoids, Indigestion, Nausea, Rectal Pain and Vomiting. Female Genitourinary Not Present- Frequency, Nocturia, Painful Urination, Pelvic Pain and Urgency. Musculoskeletal Not Present- Back Pain, Joint Pain, Joint Stiffness, Muscle Pain, Muscle Weakness and Swelling of Extremities. Neurological Not Present- Decreased Memory, Fainting, Headaches, Numbness, Seizures, Tingling, Tremor, Trouble walking and Weakness. Psychiatric Not Present- Anxiety, Bipolar, Change in Sleep Pattern, Depression, Fearful and Frequent crying. Endocrine Not Present- Cold  Intolerance, Excessive Hunger, Hair Changes, Heat Intolerance, Hot flashes and New Diabetes. Hematology Not Present- Blood Thinners, Easy Bruising, Excessive bleeding, Gland problems, HIV and Persistent Infections.  Vitals Stark Klein MD; 05/02/2018 8:01 PM) 05/02/2018 8:01 PM Weight: 127.5 lb Height: 64.5in Body Surface Area: 1.62 m Body Mass Index: 21.55 kg/m  Temp.: 98.8F  Pulse: 82 (Regular)  Resp.: 18 (Unlabored)  BP: 172/98 (Sitting, Left Arm, Standard)       Physical Exam Stark Klein MD; 05/02/2018 8:25 PM) General Mental Status-Alert. General Appearance-Consistent with stated age. Hydration-Well hydrated. Voice-Normal.  Head and Neck Head-normocephalic, atraumatic with no lesions or palpable masses. Trachea-midline. Thyroid Gland Characteristics - normal size and consistency.  Eye Eyeball - Bilateral-Extraocular movements intact. Sclera/Conjunctiva - Bilateral-No scleral icterus.  Chest and Lung Exam Chest and lung exam reveals -quiet, even and easy respiratory effort with no use of accessory muscles and on auscultation, normal breath sounds, no adventitious sounds and normal vocal resonance. Inspection Chest Wall - Normal. Back - normal.  Breast Note: Right breast wtih firm nipple and nipple retraction wtih 3 cm mobile mass. no nipple discharge. Palpable right axillary lymph node. left breast wtih an indention in the upper outer quadrant at prior lumpectomy site. no masses in the left breast or axilla.   Cardiovascular Cardiovascular examination reveals -normal heart sounds, regular rate and rhythm with no murmurs and normal pedal pulses bilaterally.  Abdomen Inspection Inspection of the abdomen reveals - No Hernias. Palpation/Percussion Palpation and Percussion of the abdomen reveal - Soft, Non Tender, No Rebound tenderness, No Rigidity (guarding) and No hepatosplenomegaly. Auscultation Auscultation of the abdomen  reveals - Bowel sounds normal.  Neurologic Neurologic evaluation reveals -alert and oriented x 3 with no impairment of recent or remote memory. Mental Status-Normal.  Musculoskeletal Global Assessment -Note:  no gross deformities.  Normal Exam - Left-Upper Extremity Strength Normal and Lower Extremity Strength Normal. Normal Exam - Right-Upper Extremity Strength Normal and Lower Extremity Strength Normal.  Lymphatic Head & Neck  General Head & Neck Lymphatics: Bilateral - Description - Normal. Axillary  General Axillary Region: Bilateral - Description - Normal. Tenderness - Non Tender. Femoral & Inguinal  Generalized Femoral & Inguinal Lymphatics: Bilateral - Description - No Generalized lymphadenopathy.    Assessment & Plan Stark Klein MD; 05/02/2018 8:36 PM) MALIGNANT NEOPLASM OF CENTRAL PORTION OF RIGHT BREAST IN FEMALE, ESTROGEN RECEPTOR POSITIVE (C50.111) Impression: Pt has a new diagnosis of a clinical T2N1 right breast cancer. Our plan would be to do a central lumpectomy with a targeted lymph node biopsy and sentinel node biopsy.  This would be followed by a mammaprint, radiation, and antiestrogen therapy. Will refer to genetics given her prior history of breast cancer.  I reviewed the surgery with the patient and daughters. I discussed placement of seed in the breast and axilla. I reviewed risk of positive margins or possible need for ALND.  The surgical procedure was described to the patient. I discussed the incision type and location and that we would need radiology involved on with a wire or seed marker and/or sentinel node.  The risks and benefits of the procedure were described to the patient and she wishes to proceed.  We discussed the risks bleeding, infection, damage to other structures, need for further procedures/surgeries. We discussed the risk of seroma. The patient was advised if the area in the breast in cancer, we may need to go back to surgery  for additional tissue to obtain negative margins or for a lymph node biopsy. The patient was advised that these are the most common complications, but that others can occur as well. They were advised against taking aspirin or other anti-inflammatory agents/blood thinners the week before surgery. BREAST CANCER METASTASIZED TO AXILLARY LYMPH NODE, RIGHT (C50.911) Impression: See above. PERSONAL HISTORY OF BREAST CANCER (Z85.3) Impression: Genetics referral Current Plans Referred to Genetic Counseling, for evaluation and follow up [employer]). Routine.   Signed by Stark Klein, MD (05/02/2018 8:39 PM)

## 2018-05-15 NOTE — Progress Notes (Signed)
Assisted Dr. Daiva Huge with right, ultrasound guided, pectoralis block. Side rails up, monitors on throughout procedure. See vital signs in flow sheet. Tolerated Procedure well.

## 2018-05-15 NOTE — Anesthesia Procedure Notes (Signed)
Anesthesia Regional Block: Pectoralis block   Pre-Anesthetic Checklist: ,, timeout performed, Correct Patient, Correct Site, Correct Laterality, Correct Procedure, Correct Position, site marked, Risks and benefits discussed, pre-op evaluation,  At surgeon's request and post-op pain management  Laterality: Right  Prep: Maximum Sterile Barrier Precautions used, chloraprep       Needles:  Injection technique: Single-shot  Needle Type: Echogenic Stimulator Needle     Needle Length: 9cm  Needle Gauge: 22     Additional Needles:   Procedures:,,,, ultrasound used (permanent image in chart),,,,  Narrative:  Start time: 05/15/2018 7:20 AM End time: 05/15/2018 7:22 AM Injection made incrementally with aspirations every 5 mL.  Performed by: Personally  Anesthesiologist: Brennan Bailey, MD  Additional Notes: Risks, benefits, and alternative discussed. Patient gave consent for procedure. Patient prepped and draped in sterile fashion. Sedation administered, patient remains easily responsive to voice. Relevant anatomy identified with ultrasound guidance. Local anesthetic given in 5cc increments with no signs or symptoms of intravascular injection. No pain or paraesthesias with injection. Patient monitored throughout procedure with signs of LAST or immediate complications. Tolerated well. Ultrasound image placed in chart.  Tawny Asal, MD

## 2018-05-15 NOTE — Anesthesia Procedure Notes (Signed)
Procedure Name: LMA Insertion Date/Time: 05/15/2018 7:45 AM Performed by: Signe Colt, CRNA Pre-anesthesia Checklist: Patient identified, Emergency Drugs available, Suction available and Patient being monitored Patient Re-evaluated:Patient Re-evaluated prior to induction Oxygen Delivery Method: Circle system utilized Preoxygenation: Pre-oxygenation with 100% oxygen Induction Type: IV induction Ventilation: Mask ventilation without difficulty LMA: LMA inserted LMA Size: 4.0 Number of attempts: 1 Airway Equipment and Method: Bite block Placement Confirmation: positive ETCO2 Tube secured with: Tape Dental Injury: Teeth and Oropharynx as per pre-operative assessment

## 2018-05-16 ENCOUNTER — Encounter (HOSPITAL_BASED_OUTPATIENT_CLINIC_OR_DEPARTMENT_OTHER): Payer: Self-pay | Admitting: General Surgery

## 2018-05-22 ENCOUNTER — Ambulatory Visit: Payer: Medicare Other | Admitting: Family Medicine

## 2018-05-23 NOTE — Progress Notes (Signed)
[facility]   [phone] Fax:([phone]) [phone]   Clinic Follow up Note   Patient Care Team: Lucretia Kern, DO as PCP - General (Family Medicine) Stark Klein, MD as Consulting Physician (General Surgery) Truitt Merle, MD as Consulting Physician (Hematology) Gery Pray, MD as Consulting Physician (Radiation Oncology)  Date of Service:  05/25/2018  CHIEF COMPLAINT: F/u of right breast cancer   SUMMARY OF ONCOLOGIC HISTORY: Oncology History   Cancer Staging Malignant neoplasm of upper-outer quadrant of right breast in female, estrogen receptor positive ([place]) Staging form: Breast, AJCC 8th Edition - Clinical stage from 04/20/2018: Stage IIA (cT2, cN1, cM0, G1, ER+, PR+, HER2-) - Unsigned - Pathologic stage from 05/15/2018: Stage IB (pT2, pN1a, cM0, G2, ER+, PR+, HER2-) - Signed by Truitt Merle, MD on 05/25/2018       Malignant neoplasm of upper-outer quadrant of right breast in female, estrogen receptor positive ([place])   04/18/2018 Mammogram    Diagnostic [place] 04/18/18  IMPRESSION: Suspicious mass measuring 2.4 x 1.3 centimeters in the retroareolar region of the RIGHT breast  Enlarged RIGHT axillary lymph node.    04/20/2018 Initial Biopsy    Diagnosis 04/20/18 1. Breast, right, needle core biopsy, 11 o'clock - INVASIVE DUCTAL CARCINOMA. - SEE COMMENT. 2. Lymph node, needle/core biopsy, right axilla - METASTATIC CARCINOMA IN 1 OF 1 LYMPH NODE (1/1). - SEE COMMENT. Microscopic Comment 1. The carcinoma appears grade I. A breast prognostic profile will be performed and the results reported separately. 2. The carcinoma in part 2 is somewhat morphologically dissimilar from that seen in part 1. A breast prognostic profile will also be performed on part 2 and the results reported separately. The results were called to [facility] on 04/23/2018. (JBK:ecj 04/23/2018)    04/20/2018 Receptors her2    IMMUNOHISTOCHEMICAL AND MORPHOMETRIC ANALYSIS  PERFORMED MANUALLY The tumor cells are NEGATIVE for Her2 (1+). Estrogen Receptor: 100%, POSITIVE, STRONG STAINING INTENSITY Progesterone Receptor: 50%, POSITIVE, STRONG STAINING INTENSITY Proliferation Marker Ki67: 40%    04/27/2018 Initial Diagnosis    Malignant neoplasm of upper-outer quadrant of right breast in female, estrogen receptor positive ([place])    05/15/2018 Surgery    RIGHT BREAST LUMPECTOMY WITH RADIOACTIVE SEED AND RIGHT RADIOACTIVE SEED TARGETED LYMPH NODE BIOPSY AND RIGHT SENTINEL LYMPH NODE BIOPSY by Dr. Barry Dienes  05/15/18     05/15/2018 Pathology Results    Diagnosis 1. Breast, lumpectomy, Right w/seed - INVASIVE DUCTAL CARCINOMA, GRADE II, 3.7 CM. - DUCTAL CARCINOMA IN SITU, INTERMEDIATE NUCLEAR GRADE. - LOBULAR CARCINOMA IN SITU. - CARCINOMA IS 1 MM FROM THE POSTERIOR MARGIN AND 4 MM FROM THE SUPERIOR MARGIN. SEE NOTE - CARCINOMA FOCALLY INVOLVES SUBAREOLAR DERMIS BUT NIPPLE IS NOT INVOLVED. - DUCTAL CARCINOMA IN SITU IS 0.5 CM FROM THE MEDIAL MARGIN - NEGATIVE FOR LYMPHOVASCULAR OR PERINEURAL INVASION. - BIOPSY SITE CHANGES. - SEE ONCOLOGY TABLE 2. Lymph node, sentinel, biopsy, Right axillary 1&2 with seed - METASTATIC DUCTAL CARCINOMA TO A LYMPH NODE (1/1). - METASTATIC FOCUS MEASURES 2.3 CM AND SHOWS EXTRANODAL EXTENSION. 3. Lymph node, sentinel, biopsy, Right - LYMPH NODE, NEGATIVE FOR CARCINOMA (0/1). - IMMUNOSTAIN FOR PANKERATIN (AE1/AE3) IS NEGATIVE 4. Lymph node, sentinel, biopsy, Right axillary #3 - LYMPH NODE, NEGATIVE FOR CARCINOMA (0/1). - IMMUNOSTAIN FOR PANKERATIN (AE1/AE3) IS NEGATIVE       CURRENT THERAPY:  PENDING adjuvant radiation  -PENDING adjuvant Anastrozole   INTERVAL HISTORY:  Kristina Bridges is here for a follow up of right breast cancer post  lumpectomy. She presents to the clinic today by herself. She notes she is doing well and still being very active but not doing as much exercise as before. She plans to follow up with Dr.  Barry Dienes next month. She notes having mild axillary swelling post surgery.  She notes she eats a well balanced diet and has adequate bowel movements.     REVIEW OF SYSTEMS:   Constitutional: Denies fevers, chills or abnormal weight loss Eyes: Denies blurriness of vision Ears, nose, mouth, throat, and face: Denies mucositis or sore throat Respiratory: Denies cough, dyspnea or wheezes Cardiovascular: Denies palpitation, chest discomfort or lower extremity swelling Gastrointestinal:  Denies nausea, heartburn or change in bowel habits Skin: Denies abnormal skin rashes Lymphatics: Denies new lymphadenopathy or easy bruising Neurological:Denies numbness, tingling or new weaknesses Behavioral/Psych: Mood is stable, no new changes  All other systems were reviewed with the patient and are negative.  MEDICAL HISTORY:  Past Medical History:  Diagnosis Date  . Breast cancer ([place])    remote, tx with surgery and radiation - no recurrence  . Chicken pox   . Family history of cervical cancer   . Family history of leukemia   . Family history of prostate cancer   . Family history of throat cancer   . Hypertension   . Personal history of radiation therapy   . Seasonal allergies     SURGICAL HISTORY: Past Surgical History:  Procedure Laterality Date  . BREAST BIOPSY  1992  . BREAST LUMPECTOMY Left   . BREAST LUMPECTOMY WITH RADIOACTIVE SEED AND SENTINEL LYMPH NODE BIOPSY Right 05/15/2018   Procedure: RIGHT BREAST LUMPECTOMY WITH RADIOACTIVE SEED AND RIGHT RADIOACTIVE SEED TARGETED LYMPH NODE BIOPSY AND RIGHT SENTINEL LYMPH NODE BIOPSY;  Surgeon: Stark Klein, MD;  Location: [facility];  Service: General;  Laterality: Right;    I have reviewed the social history and family history with the patient and they are unchanged from previous note.  ALLERGIES:  has No Known Allergies.  MEDICATIONS:  Current Outpatient Medications  Medication Sig Dispense Refill  . acetaminophen  (TYLENOL) 500 MG tablet Take 500 mg by mouth as needed.    Marland Kitchen amLODipine (NORVASC) 5 MG tablet TAKE 1 TABLET(5 MG) BY MOUTH DAILY 90 tablet 1  . Multiple Vitamin (MULTIVITAMIN) capsule Take 1 capsule by mouth daily.     No current facility-administered medications for this visit.     PHYSICAL EXAMINATION: ECOG PERFORMANCE STATUS: 0 - Asymptomatic  Vitals:   05/25/18 1451  BP: (!) 171/84  Pulse: 76  Resp: 17  Temp: 98.7 F (37.1 C)  SpO2: 100%   Filed Weights   05/25/18 1451  Weight: 130 lb 8 oz (59.2 kg)    GENERAL:alert, no distress and comfortable SKIN: skin color, texture, turgor are normal, no rashes or significant lesions EYES: normal, Conjunctiva are pink and non-injected, sclera clear OROPHARYNX:no exudate, no erythema and lips, buccal mucosa, and tongue normal  NECK: supple, thyroid normal size, non-tender, without nodularity LYMPH:  no palpable lymphadenopathy in the cervical, axillary or inguinal LUNGS: clear to auscultation and percussion with normal breathing effort HEART: regular rate & rhythm and no murmurs and no lower extremity edema ABDOMEN:abdomen soft, non-tender and normal bowel sounds Musculoskeletal:no cyanosis of digits and no clubbing  NEURO: alert & oriented x 3 with fluent speech, no focal motor/sensory deficits BREAST: S/p right breast lumpectomy: Surgical incision healing very well with 3cm seroma in right axilla. (+) No other palpable mass or adenopathy  LABORATORY DATA:  I have reviewed the data as listed CBC Latest Ref Rng & Units 05/02/2018 05/23/2017 03/17/2016  WBC 4.0 - 10.5 K/uL 8.5 6.6 8.7  Hemoglobin 12.0 - 15.0 g/dL 13.7 14.1 13.8  Hematocrit 36.0 - 46.0 % 42.6 42.2 41.3  Platelets 150 - 400 K/uL 267 314.0 265.0     CMP Latest Ref Rng & Units 05/02/2018 11/20/2017 05/23/2017  Glucose 70 - 99 mg/dL 91 93 94  BUN 8 - 23 mg/dL '17 15 16  ' Creatinine 0.44 - 1.00 mg/dL 0.85 0.83 0.71  Sodium 135 - 145 mmol/L 139 138 139  Potassium 3.5 -  5.1 mmol/L 4.2 4.9 4.5  Chloride 98 - 111 mmol/L 101 102 102  CO2 22 - 32 mmol/L 29 32 30  Calcium 8.9 - 10.3 mg/dL 9.3 9.9 9.7  Total Protein 6.5 - 8.1 g/dL 7.4 - -  Total Bilirubin 0.3 - 1.2 mg/dL 0.6 - -  Alkaline Phos 38 - 126 U/L 103 - -  AST 15 - 41 U/L 18 - -  ALT 0 - 44 U/L 13 - -      RADIOGRAPHIC STUDIES: I have personally reviewed the radiological images as listed and agreed with the findings in the report. No results found.   ASSESSMENT & PLAN:  Kristina Bridges is a 78 y.o. female with   1. Malignant neoplasm of upper-outer quadrant of right breast , Stage IB, pT2N1aM0, ER+/PR/HER2 -, Grade II -She was recently diagnosed in 04/2018. She is s/p right breast lumpectomy` and sentinel lymph node biopsy. -We reviewed her surgical pathology which showed complete surgical resection of tumor with negative margins and 1/3 LN were positive.  -Given her positive LN and Grade II disease, I discussed the option of Mammaprint test on her surgical sample to determine her risk of recurrence and benefit of chemotherapy. She declined as she does not want chemotherapy regardless of her risk of recurrence.  -I discussed proceeding with radiation to reduce local recurrence and antiestrogen therapy to reduce her risk of distant recurrence.  --Given the strong ER and PR positivity, I do recommend adjuvant aromatase inhibitor to reduce her risk of cancer recurrence,  The potential benefit and side effects, which includes but not limited to, hot flash, skin and vaginal dryness, metabolic changes ( increased blood glucose, cholesterol, weight, etc.), slightly in increased risk of cardiovascular disease, cataracts, muscular and joint discomfort, osteopenia and osteoporosis, etc, were discussed with her in great details. She is interested, and we'll start after she completes radiation. -she will call me after she completes adjuvant radiation, and I will call you and anastrozole for her to  start. -I encouraged her to continue a good job on remaining healthy with exercise and balanced diet.  She is very compliant, exercise quite a bit. --She will follow up with Dr. Barry Dienes and discussed her right axillary seroma.  -F/u in 5 months  2. H/o Left breast cancer -She was diagnosed in 31. Treated with Left breast lumpectomy and radiation in [place].  -Given this is her second breast cancer and strong family history of cancer she is eligible for genetic cancer, but she declined.   3. HTN -Continue Amlodipine and follow-up with PCP -BP at 171/84 (05/25/18). She will follow up with PCP to see if she should increase her medication dose. I encouraged her to monitor her BP at home.   4. Bone Health  -She has not had a bone density scan before as she declined in the past.  -  I discussed Anastrozole can weaken her bone, so I recommend she get a baseline DEXA to monitor. She would like to wait for now.   PLAN:  -Proceed with radiation when she recovers well from surgery  -She will call me when she completes radiation, and I will call in anastrozole for her to start. -Lab and f/u in 5 months   No problem-specific Assessment & Plan notes found for this encounter.   No orders of the defined types were placed in this encounter.  All questions were answered. The patient knows to call the clinic with any problems, questions or concerns. No barriers to learning was detected. I spent 20 minutes counseling the patient face to face. The total time spent in the appointment was 25 minutes and more than 50% was on counseling and review of test results     Truitt Merle, MD 05/25/2018   I, Joslyn Devon, am acting as scribe for Truitt Merle, MD.   I have reviewed the above documentation for accuracy and completeness, and I agree with the above.

## 2018-05-24 ENCOUNTER — Ambulatory Visit: Payer: Medicare Other

## 2018-05-25 ENCOUNTER — Inpatient Hospital Stay: Payer: Medicare Other | Attending: Hematology | Admitting: Hematology

## 2018-05-25 ENCOUNTER — Encounter: Payer: Self-pay | Admitting: Hematology

## 2018-05-25 ENCOUNTER — Telehealth: Payer: Self-pay | Admitting: Hematology

## 2018-05-25 VITALS — BP 171/84 | HR 76 | Temp 98.7°F | Resp 17 | Ht 64.5 in | Wt 130.5 lb

## 2018-05-25 DIAGNOSIS — Z923 Personal history of irradiation: Secondary | ICD-10-CM | POA: Diagnosis not present

## 2018-05-25 DIAGNOSIS — Z853 Personal history of malignant neoplasm of breast: Secondary | ICD-10-CM | POA: Diagnosis not present

## 2018-05-25 DIAGNOSIS — Z8042 Family history of malignant neoplasm of prostate: Secondary | ICD-10-CM | POA: Diagnosis not present

## 2018-05-25 DIAGNOSIS — Z806 Family history of leukemia: Secondary | ICD-10-CM | POA: Insufficient documentation

## 2018-05-25 DIAGNOSIS — C50411 Malignant neoplasm of upper-outer quadrant of right female breast: Secondary | ICD-10-CM | POA: Diagnosis not present

## 2018-05-25 DIAGNOSIS — I10 Essential (primary) hypertension: Secondary | ICD-10-CM | POA: Insufficient documentation

## 2018-05-25 DIAGNOSIS — Z79899 Other long term (current) drug therapy: Secondary | ICD-10-CM | POA: Diagnosis not present

## 2018-05-25 DIAGNOSIS — Z170 Estrogen receptor positive status [ER+]: Secondary | ICD-10-CM | POA: Insufficient documentation

## 2018-05-25 DIAGNOSIS — Z8049 Family history of malignant neoplasm of other genital organs: Secondary | ICD-10-CM | POA: Insufficient documentation

## 2018-05-25 NOTE — Telephone Encounter (Signed)
Scheduled appt per 02/14 los.  Printed calendar and mailed it.

## 2018-05-28 ENCOUNTER — Other Ambulatory Visit: Payer: Self-pay | Admitting: *Deleted

## 2018-05-28 DIAGNOSIS — C50411 Malignant neoplasm of upper-outer quadrant of right female breast: Secondary | ICD-10-CM

## 2018-05-28 DIAGNOSIS — Z170 Estrogen receptor positive status [ER+]: Principal | ICD-10-CM

## 2018-05-29 ENCOUNTER — Telehealth: Payer: Self-pay | Admitting: *Deleted

## 2018-05-29 NOTE — Telephone Encounter (Signed)
Noted  

## 2018-05-29 NOTE — Telephone Encounter (Signed)
-----   Message from Lucretia Kern, DO sent at 05/29/2018  1:02 PM EST ----- Can you reach out to her? Onc has advised her to follow up for blood pressure. Please help her with appointment. Thanks. ----- Message ----- From: Truitt Merle, MD Sent: 05/25/2018   4:51 PM EST To: Lucretia Kern, DO

## 2018-05-29 NOTE — Telephone Encounter (Signed)
I called the pt and left a detailed message stating to call back for an appt as Dr Burr Medico recommended she be seen by Dr Maudie Mercury for her blood pressure.

## 2018-05-29 NOTE — Telephone Encounter (Signed)
Pt has been scheduled, 2/20 at 10:15 am

## 2018-05-29 NOTE — Progress Notes (Signed)
HPI:  Using dictation device. Unfortunately this device frequently misinterprets words/phrases.  Kristina Bridges is a pleasant 78 yo, unfortunately currently fighting breast cancer, here for elevated blood pressure. Blood pressure was elevated at her recent oncology visit and they advised her to come to see [place] about this. We haven't seen her in some time; her blood pressure was perfect at her last visit here 11/2017. She take norvasc 5 mg daily. Also has a history of hyperlipidemia and is due for a lipid recheck. Reports feels well despite treatments. Exercising daily. No CP, SOB, DOE.    ROS: See pertinent positives and negatives per HPI.  Past Medical History:  Diagnosis Date  . Breast cancer ([place])    remote, tx with surgery and radiation - no recurrence  . Chicken pox   . Family history of cervical cancer   . Family history of leukemia   . Family history of prostate cancer   . Family history of throat cancer   . Hypertension   . Personal history of radiation therapy   . Seasonal allergies     Past Surgical History:  Procedure Laterality Date  . BREAST BIOPSY  1992  . BREAST LUMPECTOMY Left   . BREAST LUMPECTOMY WITH RADIOACTIVE SEED AND SENTINEL LYMPH NODE BIOPSY Right 05/15/2018   Procedure: RIGHT BREAST LUMPECTOMY WITH RADIOACTIVE SEED AND RIGHT RADIOACTIVE SEED TARGETED LYMPH NODE BIOPSY AND RIGHT SENTINEL LYMPH NODE BIOPSY;  Surgeon: Stark Klein, MD;  Location: [facility];  Service: General;  Laterality: Right;    Family History  Problem Relation Age of Onset  . Hypertension Other   . Hypertension Mother   . Cervical cancer Sister   . Cancer Brother        throat cancer   . Prostate cancer Brother   . Leukemia Daughter        CML  . Cervical cancer Daughter   . Prostate cancer Son   . Cervical cancer Sister     SOCIAL HX: se ehpi   Current Outpatient Medications:  .  acetaminophen (TYLENOL) 500 MG tablet, Take 500 mg by mouth as  needed., Disp: , Rfl:  .  amLODipine (NORVASC) 5 MG tablet, Take 1.5 tablets (7.5 mg total) by mouth daily., Disp: 135 tablet, Rfl: 1 .  Multiple Vitamin (MULTIVITAMIN) capsule, Take 1 capsule by mouth daily., Disp: , Rfl:   EXAM:  Vitals:   05/31/18 1007  BP: 140/70  Pulse: 66  Temp: 97.8 F (36.6 C)    Body mass index is 21.28 kg/m.  GENERAL: vitals reviewed and listed above, alert, oriented, appears well hydrated and in no acute distress  HEENT: atraumatic, conjunttiva clear, no obvious abnormalities on inspection of external nose and ears  NECK: no obvious masses on inspection  LUNGS: clear to auscultation bilaterally, no wheezes, rales or rhonchi, good air movement  CV: HRRR, no peripheral edema  MS: moves all extremities without noticeable abnormality  PSYCH: pleasant and cooperative, no obvious depression or anxiety  ASSESSMENT AND PLAN:  Discussed the following assessment and plan:  Essential hypertension  Malignant neoplasm of upper-outer quadrant of right breast in female, estrogen receptor positive ([facility])  Mild hyperlipidemia - Plan: Lipid panel  -on ROC BP up at other OVs and up some today, discussed options and risks. Decided with her consent to increase the norvasc to 7.5mg  -checking lipids -seeing specialists for management breast Ca -follow up 1 month for recheck BP -declines vaccines -Patient advised to return or notify a  doctor immediately if symptoms new concerns arise.  Patient Instructions  BEFORE YOU LEAVE: -follow up: 1 month  Please increase the Norvasc to 7.5 mg (1.5 tablets) per day.  Keeping you in our prayers.   Lucretia Kern, DO

## 2018-05-31 ENCOUNTER — Encounter: Payer: Self-pay | Admitting: Family Medicine

## 2018-05-31 ENCOUNTER — Ambulatory Visit (INDEPENDENT_AMBULATORY_CARE_PROVIDER_SITE_OTHER): Payer: Medicare Other | Admitting: Family Medicine

## 2018-05-31 VITALS — BP 140/70 | HR 66 | Temp 97.8°F | Ht 64.5 in | Wt 125.9 lb

## 2018-05-31 DIAGNOSIS — Z170 Estrogen receptor positive status [ER+]: Secondary | ICD-10-CM | POA: Diagnosis not present

## 2018-05-31 DIAGNOSIS — I10 Essential (primary) hypertension: Secondary | ICD-10-CM

## 2018-05-31 DIAGNOSIS — E785 Hyperlipidemia, unspecified: Secondary | ICD-10-CM | POA: Diagnosis not present

## 2018-05-31 DIAGNOSIS — C50411 Malignant neoplasm of upper-outer quadrant of right female breast: Secondary | ICD-10-CM | POA: Diagnosis not present

## 2018-05-31 LAB — LIPID PANEL
Cholesterol: 215 mg/dL — ABNORMAL HIGH (ref 0–200)
HDL: 66.4 mg/dL (ref >39.00)
LDL Cholesterol: 134 mg/dL — ABNORMAL HIGH (ref 0–99)
NONHDL: 148.93
Total CHOL/HDL Ratio: 3
Triglycerides: 74 mg/dL (ref 0.0–149.0)
VLDL: 14.8 mg/dL (ref 0.0–40.0)

## 2018-05-31 MED ORDER — AMLODIPINE BESYLATE 5 MG PO TABS: 7.5000 mg | ORAL_TABLET | Freq: Every day | ORAL | 1 refills | Status: DC

## 2018-05-31 NOTE — Patient Instructions (Signed)
BEFORE YOU LEAVE: -follow up: 1 month  Please increase the Norvasc to 7.5 mg (1.5 tablets) per day.  Keeping you in our prayers.

## 2018-06-08 NOTE — Progress Notes (Signed)
Location of Breast Cancer: Malignant neoplasm of upper-outer quadrant of right breast in female, estrogen receptor positive   Histology per Pathology Report: Biopsy on January 10 showed: invasive ductal carcinoma in the right breast at 11 o'clock and metastatic carcinoma in a right axilla lymph node. Prognostic indicators in the breast significant for: estrogen receptor, 100% positive and progesterone receptor, 50% positive, both with strong staining intensity. Proliferation marker Ki67 at 40%. HER2 negative. Prognostic indicators in the axillary node significant for: estrogen receptor, 100% positive with strong staining intensity and progesterone receptor, 0% negative/ Proliferation marker Ki67 at 15%  Receptor Status: ER(100%), PR (0%), Her2-neu (negative), Ki-(15%)  Did patient present with symptoms (if so, please note symptoms) or was this found on screening mammography?: She had routine screening mammography on January 2 that showed a possible abnormality in the right breast. She underwent bilateral diagnostic mammography with tomography and right breast ultrasonography at [facility] on January 8 showing: suspicious mass in the retroareolar region of the right breast and enlarged right axillary lymph node. Prior to this mammogram the patient has noticed swelling along the nipple area and a palpable lump in the retroareolar area.   Past/Anticipated interventions by surgeon, if any: 05/15/18:Right Breast Radioactive seed localized lumpectomy, right seed targeted deep lymph node biopsy, and sentinel lymph node biopsy Surgeon: Stark Klein    Past/Anticipated interventions by medical oncology, if any: Chemotherapy Per Dr. Burr Medico 05/25/18:  PLAN: -Proceed with radiation when she recovers well from surgery  -She will call me when she completes radiation, and I will call in anastrozole for her to start. -Lab and f/u in 5 months   Lymphedema issues, if any:  Pt reports small area of swelling in  axilla.  Pain issues, if any:  Pt denies c/o pain.  SAFETY ISSUES:  Prior radiation? Yes, LEFT breast radiation in 1992  Pacemaker/ICD? No  Possible current pregnancy? No  Is the patient on methotrexate? No  Current Complaints / other details:  Pt presents today for re-consult with Dr. Sondra Come for Radiation Oncology. Pt is unaccompanied. Pt is very active and does multiple exercises daily.   BP (!) 199/90 (BP Location: Left Leg, Patient Position: Sitting)   Pulse 68   Temp 98.1 F (36.7 C) (Oral)   Resp 18   Ht '5\' 4"'  (1.626 m)   Wt 126 lb 3.2 oz (57.2 kg)   SpO2 95%   BMI 21.66 kg/m   Wt Readings from Last 3 Encounters:  06/13/18 126 lb 3.2 oz (57.2 kg)  05/31/18 125 lb 14.4 oz (57.1 kg)  05/25/18 130 lb 8 oz (59.2 kg)       Loma Sousa, RN 06/13/2018,3:30 PM

## 2018-06-11 ENCOUNTER — Ambulatory Visit: Payer: Medicare Other | Attending: General Surgery | Admitting: Physical Therapy

## 2018-06-11 ENCOUNTER — Other Ambulatory Visit: Payer: Self-pay

## 2018-06-11 ENCOUNTER — Encounter: Payer: Self-pay | Admitting: Physical Therapy

## 2018-06-11 DIAGNOSIS — M25611 Stiffness of right shoulder, not elsewhere classified: Secondary | ICD-10-CM | POA: Insufficient documentation

## 2018-06-11 DIAGNOSIS — C50411 Malignant neoplasm of upper-outer quadrant of right female breast: Secondary | ICD-10-CM | POA: Diagnosis not present

## 2018-06-11 DIAGNOSIS — Z170 Estrogen receptor positive status [ER+]: Secondary | ICD-10-CM | POA: Diagnosis not present

## 2018-06-11 DIAGNOSIS — Z483 Aftercare following surgery for neoplasm: Secondary | ICD-10-CM | POA: Diagnosis not present

## 2018-06-11 DIAGNOSIS — R293 Abnormal posture: Secondary | ICD-10-CM | POA: Diagnosis not present

## 2018-06-11 NOTE — Therapy (Signed)
[place], [place], [address] Phone: [phone]   Fax:  [phone]  Physical Therapy Treatment  Patient Details  Name: Kristina Bridges MRN: [id number] Date of Birth: 1940/12/27 Referring Provider (PT): Dr. Stark Klein   Encounter Date: 06/11/2018  PT End of Session - 06/11/18 1456    Visit Number  2    Number of Visits  2    PT Start Time  1430    PT Stop Time  1500    PT Time Calculation (min)  30 min    Activity Tolerance  Patient tolerated treatment well    Behavior During Therapy  [facility] for tasks assessed/performed       Past Medical History:  Diagnosis Date  . Breast cancer ([place])    remote, tx with surgery and radiation - no recurrence  . Chicken pox   . Family history of cervical cancer   . Family history of leukemia   . Family history of prostate cancer   . Family history of throat cancer   . Hypertension   . Personal history of radiation therapy   . Seasonal allergies     Past Surgical History:  Procedure Laterality Date  . BREAST BIOPSY  1992  . BREAST LUMPECTOMY Left   . BREAST LUMPECTOMY WITH RADIOACTIVE SEED AND SENTINEL LYMPH NODE BIOPSY Right 05/15/2018   Procedure: RIGHT BREAST LUMPECTOMY WITH RADIOACTIVE SEED AND RIGHT RADIOACTIVE SEED TARGETED LYMPH NODE BIOPSY AND RIGHT SENTINEL LYMPH NODE BIOPSY;  Surgeon: Stark Klein, MD;  Location: [facility];  Service: General;  Laterality: Right;    There were no vitals filed for this visit.  Subjective Assessment - 06/11/18 1433    Subjective  Patient reports she underwent a right lumpectomy and sentinel node biopsy (1/3 nodes positive). She will undergo radiation but no chemotherapy. She just saw her surgeon and she said she was ready for radiation.    Pertinent History  Patient was diagnosed on 04/12/2018 with right grade I invasive ductal carcinoma breast cancer. It is ER/PR positive and HER2 negative with  a Ki67 of 40%. Patient reports she underwent a right lumpectomy and sentinel node biopsy (1/3 nodes positive). She had left breast cancer in 1992 with mild lymphedema which she reports has resolved.    Patient Stated Goals  See if my arm is ok    Currently in Pain?  No/denies         [facility] PT Assessment - 06/11/18 0001      Assessment   Medical Diagnosis  Right breast cancer    Referring Provider (PT)  Dr. Stark Klein    Onset Date/Surgical Date  05/15/18    Hand Dominance  Right    Prior Therapy  Baseline      Precautions   Precautions  Other (comment)    Precaution Comments  recent cancer surgery; left arm lymphedema      Restrictions   Weight Bearing Restrictions  No      Balance Screen   Has the patient fallen in the past 6 months  No    Has the patient had a decrease in activity level because of a fear of falling?   No    Is the patient reluctant to leave their home because of a fear of falling?   No      Home Social worker  Private residence    Living Arrangements  Alone    Available Help  at Discharge  Family      Prior Function   Level of [place]  Retired    Leisure  She exercises regularly in her house and walks daily at least 45 minutes      Cognition   Overall Cognitive Status  Within Functional Limits for tasks assessed      Observation/Other Assessments   Observations  Axillary incision and breast incision both appear to be healing well. Steri-strips present on breast.      Posture/Postural Control   Posture/Postural Control  Postural limitations    Postural Limitations  Forward head;Rounded Shoulders;Increased thoracic kyphosis      ROM / Strength   AROM / PROM / Strength  AROM      AROM   AROM Assessment Site  Shoulder    Right/Left Shoulder  Right    Right Shoulder Extension  61 Degrees    Right Shoulder Flexion  132 Degrees    Right Shoulder ABduction  143 Degrees    Right Shoulder Internal  Rotation  70 Degrees    Right Shoulder External Rotation  78 Degrees        LYMPHEDEMA/ONCOLOGY QUESTIONNAIRE - 06/11/18 1449      Type   Cancer Type  Right breast cancer      Surgeries   Lumpectomy Date  05/15/18    Sentinel Lymph Node Biopsy Date  05/15/18    Number Lymph Nodes Removed  3      Treatment   Active Chemotherapy Treatment  No    Past Chemotherapy Treatment  No    Active Radiation Treatment  No    Past Radiation Treatment  No    Current Hormone Treatment  No    Past Hormone Therapy  No      What other symptoms do you have   Are you Having Heaviness or Tightness  No    Are you having Pain  No    Are you having pitting edema  No    Is it Hard or Difficult finding clothes that fit  No    Do you have infections  No    Is there Decreased scar mobility  No    Stemmer Sign  No      Lymphedema Assessments   Lymphedema Assessments  Upper extremities      Right Upper Extremity Lymphedema   10 cm Proximal to Olecranon Process  23.3 cm    Olecranon Process  21.8 cm    10 cm Proximal to Ulnar Styloid Process  19.1 cm    Just Proximal to Ulnar Styloid Process  14.2 cm    Across Hand at [employer]  18.2 cm    At Susan Moore of 2nd Digit  5.5 cm      Left Upper Extremity Lymphedema   10 cm Proximal to Olecranon Process  24.5 cm    Olecranon Process  23.4 cm    10 cm Proximal to Ulnar Styloid Process  19.3 cm    Just Proximal to Ulnar Styloid Process  14.9 cm    Across Hand at [employer]  17.8 cm    At [place] of 2nd Digit  5.3 cm        Quick Dash - 06/11/18 0001    Open a tight or new jar  No difficulty    Do heavy household chores (wash walls, wash floors)  No difficulty    Carry a shopping bag or briefcase  No difficulty    Wash your back  No difficulty    Use a knife to cut food  No difficulty    Recreational activities in which you take some force or impact through your arm, shoulder, or hand (golf, hammering, tennis)  No difficulty    During the past  week, to what extent has your arm, shoulder or hand problem interfered with your normal social activities with family, friends, neighbors, or groups?  Not at all    During the past week, to what extent has your arm, shoulder or hand problem limited your work or other regular daily activities  Not at all    Arm, shoulder, or hand pain.  None    Tingling (pins and needles) in your arm, shoulder, or hand  None    Difficulty Sleeping  No difficulty    DASH Score  0 %                     PT Education - 06/11/18 1449    Education Details  Post op HEP and shoulder abduction stretch closed chain    Person(s) Educated  Patient    Methods  Explanation;Demonstration;Handout    Comprehension  Verbalized understanding;Returned demonstration          PT Long Term Goals - 06/11/18 1500      PT LONG TERM GOAL #1   Title  Patient will demonstrate she has regained full shoulder ROM and function post operatively compared to baselines.    Time  8    Period  Weeks    Status  Partially Met            Plan - 06/11/18 1458    Clinical Impression Statement  Patient is doing very well s.p right lumpectomy and sentinel node biopsy. She has regained almost full ROM (lacking only a few degrees of shoulder abduction), shows no signs of lymphedema, and has regained full shoulder function. She has resumed her normal exercise routine and does not feel she needs PT at this time.    Rehab Potential  Excellent    Clinical Impairments Affecting Rehab Potential  None    PT Treatment/Interventions  ADLs/Self Care Home Management;Therapeutic exercise;Patient/family education    PT Next Visit Plan  D/C    PT Home Exercise Plan  Post op shoulder ROM HEP; added closed chain abduction    Consulted and Agree with Plan of Care  Patient       Patient will benefit from skilled therapeutic intervention in order to improve the following deficits and impairments:  Decreased range of motion, Pain, Decreased  knowledge of precautions, Postural dysfunction, Impaired UE functional use  Visit Diagnosis: Malignant neoplasm of upper-outer quadrant of right breast in female, estrogen receptor positive ([facility])  Abnormal posture  Stiffness of right shoulder, not elsewhere classified  Aftercare following surgery for neoplasm     Problem List Patient Active Problem List   Diagnosis Date Noted  . Genetic testing 05/04/2018  . Family history of prostate cancer   . Family history of cervical cancer   . Family history of throat cancer   . Family history of leukemia   . Malignant neoplasm of upper-outer quadrant of right breast in female, estrogen receptor positive ([place]) 04/27/2018  . Essential hypertension 07/15/2016  . Seasonal allergic rhinitis 07/15/2016  . Mild hyperlipidemia 07/15/2016   PHYSICAL THERAPY DISCHARGE SUMMARY  Visits from Start of Care: 2  Current functional level related to goals / functional  outcomes: All goals met except she is lacking about 10 degrees of right shoulder abduction compared to baseline measurements.   Remaining deficits: Shoulder abduction; this is not impacting her functionally and she was educated today on how to regain that last bit of ROM.   Education / Equipment: HEP Plan: Patient agrees to discharge.  Patient goals were partially met. Patient is being discharged due to meeting the stated rehab goals.  ?????         Annia Friendly, [place] 06/11/18 3:02 PM  [place], [place], [address] Phone: [phone]   Fax:  [phone]  Name: Canyon Willow MRN: [id number] Date of Birth: 1940/07/11

## 2018-06-11 NOTE — Patient Instructions (Signed)
Closed Chain: Shoulder Abduction / Adduction - on Wall    One hand on wall, step to side and return. Stepping causes shoulder to abduct and adduct. Step _3__ times, holding 10 seconds, _3__ times per day.  http://ss.exer.us/267   Copyright  VHI. All rights reserved.

## 2018-06-13 ENCOUNTER — Encounter: Payer: Self-pay | Admitting: Radiation Oncology

## 2018-06-13 ENCOUNTER — Ambulatory Visit
Admission: RE | Admit: 2018-06-13 | Discharge: 2018-06-13 | Disposition: A | Discharge status: Home / Self Care | Payer: Medicare Other | Source: Ambulatory Visit | Attending: Radiation Oncology | Admitting: Radiation Oncology

## 2018-06-13 ENCOUNTER — Other Ambulatory Visit: Payer: Self-pay

## 2018-06-13 VITALS — BP 199/90 | HR 68 | Temp 98.1°F | Resp 18 | Ht 64.0 in | Wt 126.2 lb

## 2018-06-13 DIAGNOSIS — Z9889 Other specified postprocedural states: Secondary | ICD-10-CM | POA: Diagnosis not present

## 2018-06-13 DIAGNOSIS — Z170 Estrogen receptor positive status [ER+]: Principal | ICD-10-CM

## 2018-06-13 DIAGNOSIS — C50411 Malignant neoplasm of upper-outer quadrant of right female breast: Secondary | ICD-10-CM | POA: Insufficient documentation

## 2018-06-13 DIAGNOSIS — D0501 Lobular carcinoma in situ of right breast: Secondary | ICD-10-CM | POA: Diagnosis not present

## 2018-06-13 DIAGNOSIS — Z510 Encounter for antineoplastic radiation therapy: Secondary | ICD-10-CM | POA: Diagnosis not present

## 2018-06-13 DIAGNOSIS — C773 Secondary and unspecified malignant neoplasm of axilla and upper limb lymph nodes: Secondary | ICD-10-CM | POA: Diagnosis not present

## 2018-06-13 DIAGNOSIS — Z79899 Other long term (current) drug therapy: Secondary | ICD-10-CM | POA: Insufficient documentation

## 2018-06-13 NOTE — Progress Notes (Signed)
Radiation Oncology         [phone]) [phone] ________________________________  Name: Kristina Bridges MRN: [id number]  Date: 06/13/2018  DOB: September 23, 1940  Re-Evaluation Note  CC: Lucretia Kern, DO  Truitt Merle, MD    ICD-10-CM   1. Malignant neoplasm of upper-outer quadrant of right breast in female, estrogen receptor positive ([place]) C50.411    Z17.0     Diagnosis:   Stage IB (pT2, pN1a, cM0) Right Breast UOQ Invasive Ductal Carcinoma, ER+ / PR+ / Her2-, grade 2  Narrative:  The patient returns today for re-evaluation. She was last seen in multidisciplinary breast clinic on 05/02/2018. She is doing well overall.  Since that time, she underwent right breast lumpectomy on 05/15/2018 under Dr. Barry Dienes. 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  She met with Dr. Burr Medico on 05/25/2018. They discussed the benefit of chenotherapy considering her positive lymph node, but the patient declined chemotherapy at this time. She is interested in anti-estrogens and plans to follow up with Dr. Burr Medico after completing radiation therapy.  On review of systems, she reports small area of swelling in the right axilla. She denies any pain. She states she remains very active, including walking every day.  Of note, she has a history of left breast cancer treated with lumpectomy followed by radiation therapy under Dr. Danny Lawless in 1992.   ALLERGIES:  has No Known Allergies.  Meds: Current Outpatient Medications  Medication Sig Dispense Refill  . acetaminophen (TYLENOL) 500 MG tablet Take 500 mg by mouth as needed.    Marland Kitchen amLODipine (NORVASC) 5 MG tablet Take 1.5 tablets (7.5 mg total) by  mouth daily. 135 tablet 1  . Multiple Vitamin (MULTIVITAMIN) capsule Take 1 capsule by mouth daily.     No current facility-administered medications for this encounter.     Physical Findings: The patient is in no acute distress. Patient is alert and oriented.  height is _0  (1.626 m) and weight is 126 lb 3.2 oz (57.2 kg). Her oral temperature is 98.1 F (36.7 C). Her blood pressure is 199/90 (abnormal) and her pulse is 68. Her respiration is 18 and oxygen saturation is 95%. .  Lungs are clear to auscultation bilaterally. Heart has regular rate and rhythm. No palpable cervical, supraclavicular, or axillary adenopathy. Abdomen soft, non-tender, normal bowel sounds. Breast: Left with radiation changes from her prior treatment, mild dimpling at the lumpectomy site. Right with scar on the central aspect of the breast, healed well without signs of drainage or infection. She has a separate scar on the right axilla from her targeted lymph node dissection/sentinel lymph node procedure. Tattoos noted from previous radiation treatment.   Lab Findings: Lab Results  Component Value Date   WBC 8.5 05/02/2018   HGB 13.7 05/02/2018   HCT 42.6 05/02/2018   MCV 93.8 05/02/2018   PLT 267 05/02/2018    Radiographic Findings: Nm Sentinel Node Inj-no Rpt (breast)  Result Date: 05/15/2018 Sulfur colloid was injected by the nuclear medicine technologist for melanoma sentinel node.   Mm Breast Surgical Specimen  Result Date: 05/15/2018 CLINICAL DATA:  Status post RIGHT axillary lymph node dissection after earlier radioactive seed localization. EXAM: SPECIMEN RADIOGRAPH OF THE RIGHT AXILLA COMPARISON:  Previous exam(s). FINDINGS: Status  post excision of the RIGHT axillary lymph node. The radioactive seed and biopsy marker clip are present and completely intact. IMPRESSION: Specimen radiograph of the right axilla status post excision of a RIGHT axillary lymph node. Electronically Signed   By: Franki Cabot M.D.    On: 05/15/2018 08:45   Mm Breast Surgical Specimen  Result Date: 05/15/2018 CLINICAL DATA:  Status post RIGHT breast lumpectomy today after earlier radioactive seed localization. EXAM: SPECIMEN RADIOGRAPH OF THE RIGHT BREAST COMPARISON:  Previous exam(s). FINDINGS: Status post excision of the RIGHT breast. The radioactive seed and biopsy marker clip are present, completely intact, and were marked for pathology. The locations of the radioactive seed and biopsy marker clip within the specimen were discussed with the OR staff during the procedure. IMPRESSION: Specimen radiograph of the right breast. Electronically Signed   By: Franki Cabot M.D.   On: 05/15/2018 08:22    Impression:  Stage IB (pT2, pN1a, cM0) Right Breast UOQ Invasive Ductal Carcinoma, ER+ / PR+ / Her2-, grade 2 Patient would be a good candidate for breast conservation with radiotherapy directed at the right breast. I would also recommend axillary coverage given her positive lymph node, which measured 2.3 cm and showed extranodal extension. Patient has met with Dr. Burr Medico, and the patient does not wish to consider adjuvant chemotherapy. She does agree to adjuvant hormonal therapy with anastrozole, which will begin following her completion of radiation therapy.  Today, I talked to the patient about the findings and work-up thus far.  We discussed the natural history of breast cancer and general treatment, highlighting the role of radiotherapy in the management.  We discussed the available radiation techniques, and focused on the details of logistics and delivery.  We reviewed the anticipated acute and late sequelae associated with radiation in this setting.  The patient was encouraged to ask questions that I answered to the best of my ability.  A patient consent form was discussed and signed.  We retained a copy for our records.  The patient would like to proceed with radiation and will be scheduled for CT simulation.  Plan:  CT simulation in  the near future with subsequent radiation therapy to follow. Anticipate 5.5 weeks of radiation therapy directed at the right breast and axillary area. This will then be followed by a boost to the lumpectomy cavity given the close surgical margins.    -----------------------------------  Blair Promise, PhD, MD  This document serves as a record of services personally performed by Gery Pray, MD. It was created on his behalf by Wilburn Mylar, a trained medical scribe. The creation of this record is based on the scribe's personal observations and the provider's statements to them. This document has been checked and approved by the attending provider.

## 2018-06-26 ENCOUNTER — Ambulatory Visit
Admission: RE | Admit: 2018-06-26 | Discharge: 2018-06-26 | Disposition: A | Discharge status: Home / Self Care | Payer: Medicare Other | Source: Ambulatory Visit | Attending: Radiation Oncology | Admitting: Radiation Oncology

## 2018-06-26 ENCOUNTER — Other Ambulatory Visit: Payer: Self-pay

## 2018-06-26 DIAGNOSIS — Z170 Estrogen receptor positive status [ER+]: Secondary | ICD-10-CM | POA: Diagnosis not present

## 2018-06-26 DIAGNOSIS — Z79899 Other long term (current) drug therapy: Secondary | ICD-10-CM | POA: Diagnosis not present

## 2018-06-26 DIAGNOSIS — C50411 Malignant neoplasm of upper-outer quadrant of right female breast: Secondary | ICD-10-CM | POA: Diagnosis not present

## 2018-06-26 DIAGNOSIS — Z510 Encounter for antineoplastic radiation therapy: Secondary | ICD-10-CM | POA: Diagnosis not present

## 2018-06-30 NOTE — Progress Notes (Signed)
  Radiation Oncology         [phone]) [phone] ________________________________  Name: Kristina Bridges MRN: [id number]  Date: 06/26/2018  DOB: Jan 27, 1941  SIMULATION AND TREATMENT PLANNING NOTE    ICD-10-CM   1. Malignant neoplasm of upper-outer quadrant of right breast in female, estrogen receptor positive ([place]) C50.411    Z17.0     DIAGNOSIS:  Stage IB (pT2, pN1a, cM0) Right Breast UOQ Invasive Ductal Carcinoma, ER+ / PR+ / Her2-, grade 2  NARRATIVE:  The patient was brought to the [facility].  Identity was confirmed.  All relevant records and images related to the planned course of therapy were reviewed.  The patient freely provided informed written consent to proceed with treatment after reviewing the details related to the planned course of therapy. The consent form was witnessed and verified by the simulation staff.  Then, the patient was set-up in a stable reproducible  supine position for radiation therapy.  CT images were obtained.  Surface markings were placed.  The CT images were loaded into the planning software.  Then the target and avoidance structures were contoured.  Treatment planning then occurred.  The radiation prescription was entered and confirmed.  Then, I designed and supervised the construction of a total of 5 medically necessary complex treatment devices.  I have requested : 3D Simulation  I have requested a DVH of the following structures: Lumpectomy cavity, heart, lungs.  I have ordered:CBC  PLAN:  The patient will receive 50.4 Gy in 28 fractions directed at the right breast. The axillary area will receive 45 gray in 25 fractions. Patient will then proceed with a boost to the lumpectomy cavity of 12 gray in 6 fractions for a cumulative dose of 62.4 gray.      Optical Surface Tracking Plan:  Since intensity modulated radiotherapy (IMRT) and 3D conformal radiation treatment methods are predicated on accurate and precise positioning for  treatment, intrafraction motion monitoring is medically necessary to ensure accurate and safe treatment delivery.  The ability to quantify intrafraction motion without excessive ionizing radiation dose can only be performed with optical surface tracking. Accordingly, surface imaging offers the opportunity to obtain 3D measurements of patient position throughout IMRT and 3D treatments without excessive radiation exposure.  I am ordering optical surface tracking for this patient's upcoming course of radiotherapy. ________________________________   -----------------------------------  Blair Promise, PhD, MD

## 2018-07-02 DIAGNOSIS — Z79899 Other long term (current) drug therapy: Secondary | ICD-10-CM | POA: Diagnosis not present

## 2018-07-02 DIAGNOSIS — Z510 Encounter for antineoplastic radiation therapy: Secondary | ICD-10-CM | POA: Diagnosis not present

## 2018-07-02 DIAGNOSIS — Z170 Estrogen receptor positive status [ER+]: Secondary | ICD-10-CM | POA: Diagnosis not present

## 2018-07-02 DIAGNOSIS — C50411 Malignant neoplasm of upper-outer quadrant of right female breast: Secondary | ICD-10-CM | POA: Diagnosis not present

## 2018-07-03 ENCOUNTER — Other Ambulatory Visit: Payer: Self-pay

## 2018-07-03 ENCOUNTER — Ambulatory Visit
Admission: RE | Admit: 2018-07-03 | Discharge: 2018-07-03 | Disposition: A | Discharge status: Home / Self Care | Payer: Medicare Other | Source: Ambulatory Visit | Attending: Radiation Oncology | Admitting: Radiation Oncology

## 2018-07-03 DIAGNOSIS — C50411 Malignant neoplasm of upper-outer quadrant of right female breast: Secondary | ICD-10-CM

## 2018-07-03 DIAGNOSIS — Z170 Estrogen receptor positive status [ER+]: Secondary | ICD-10-CM | POA: Diagnosis not present

## 2018-07-03 DIAGNOSIS — Z79899 Other long term (current) drug therapy: Secondary | ICD-10-CM | POA: Diagnosis not present

## 2018-07-03 DIAGNOSIS — Z510 Encounter for antineoplastic radiation therapy: Secondary | ICD-10-CM | POA: Diagnosis not present

## 2018-07-03 NOTE — Progress Notes (Signed)
  Radiation Oncology         [phone] ________________________________  Name: Kristina Bridges MRN: [id number]  Date: 07/03/2018  DOB: [birth year]  Simulation Verification Note    ICD-10-CM   1. Malignant neoplasm of upper-outer quadrant of right breast in female, estrogen receptor positive ([place]) C50.411    Z17.0     Status: outpatient  NARRATIVE: The patient was brought to the treatment unit and placed in the planned treatment position. The clinical setup was verified. Then port films were obtained and uploaded to the radiation oncology medical record software.  The treatment beams were carefully compared against the planned radiation fields. The position location and shape of the radiation fields was reviewed. They targeted volume of tissue appears to be appropriately covered by the radiation beams. Organs at risk appear to be excluded as planned.  Based on my personal review, I approved the simulation verification. The patient's treatment will proceed as planned.  -----------------------------------  Blair Promise, PhD, MD

## 2018-07-04 ENCOUNTER — Other Ambulatory Visit: Payer: Self-pay

## 2018-07-04 ENCOUNTER — Ambulatory Visit
Admission: RE | Admit: 2018-07-04 | Discharge: 2018-07-04 | Disposition: A | Discharge status: Home / Self Care | Payer: Medicare Other | Source: Ambulatory Visit | Attending: Radiation Oncology | Admitting: Radiation Oncology

## 2018-07-04 DIAGNOSIS — Z170 Estrogen receptor positive status [ER+]: Secondary | ICD-10-CM | POA: Diagnosis not present

## 2018-07-04 DIAGNOSIS — Z510 Encounter for antineoplastic radiation therapy: Secondary | ICD-10-CM | POA: Diagnosis not present

## 2018-07-04 DIAGNOSIS — C50411 Malignant neoplasm of upper-outer quadrant of right female breast: Secondary | ICD-10-CM | POA: Diagnosis not present

## 2018-07-04 DIAGNOSIS — Z79899 Other long term (current) drug therapy: Secondary | ICD-10-CM | POA: Diagnosis not present

## 2018-07-05 ENCOUNTER — Ambulatory Visit
Admission: RE | Admit: 2018-07-05 | Discharge: 2018-07-05 | Disposition: A | Discharge status: Home / Self Care | Payer: Medicare Other | Source: Ambulatory Visit | Attending: Radiation Oncology | Admitting: Radiation Oncology

## 2018-07-05 ENCOUNTER — Other Ambulatory Visit: Payer: Self-pay

## 2018-07-05 DIAGNOSIS — C50411 Malignant neoplasm of upper-outer quadrant of right female breast: Secondary | ICD-10-CM | POA: Diagnosis not present

## 2018-07-05 DIAGNOSIS — Z79899 Other long term (current) drug therapy: Secondary | ICD-10-CM | POA: Diagnosis not present

## 2018-07-05 DIAGNOSIS — Z510 Encounter for antineoplastic radiation therapy: Secondary | ICD-10-CM | POA: Diagnosis not present

## 2018-07-05 DIAGNOSIS — Z170 Estrogen receptor positive status [ER+]: Secondary | ICD-10-CM | POA: Diagnosis not present

## 2018-07-06 ENCOUNTER — Other Ambulatory Visit: Payer: Self-pay

## 2018-07-06 ENCOUNTER — Ambulatory Visit
Admission: RE | Admit: 2018-07-06 | Discharge: 2018-07-06 | Disposition: A | Discharge status: Home / Self Care | Payer: Medicare Other | Source: Ambulatory Visit | Attending: Radiation Oncology | Admitting: Radiation Oncology

## 2018-07-06 DIAGNOSIS — Z510 Encounter for antineoplastic radiation therapy: Secondary | ICD-10-CM | POA: Diagnosis not present

## 2018-07-06 DIAGNOSIS — Z79899 Other long term (current) drug therapy: Secondary | ICD-10-CM | POA: Diagnosis not present

## 2018-07-06 DIAGNOSIS — Z170 Estrogen receptor positive status [ER+]: Secondary | ICD-10-CM | POA: Diagnosis not present

## 2018-07-06 DIAGNOSIS — C50411 Malignant neoplasm of upper-outer quadrant of right female breast: Secondary | ICD-10-CM | POA: Diagnosis not present

## 2018-07-09 ENCOUNTER — Ambulatory Visit
Admission: RE | Admit: 2018-07-09 | Discharge: 2018-07-09 | Disposition: A | Discharge status: Home / Self Care | Payer: Medicare Other | Source: Ambulatory Visit | Attending: Radiation Oncology | Admitting: Radiation Oncology

## 2018-07-09 ENCOUNTER — Other Ambulatory Visit: Payer: Self-pay

## 2018-07-09 DIAGNOSIS — Z510 Encounter for antineoplastic radiation therapy: Secondary | ICD-10-CM | POA: Diagnosis not present

## 2018-07-09 DIAGNOSIS — Z79899 Other long term (current) drug therapy: Secondary | ICD-10-CM | POA: Diagnosis not present

## 2018-07-09 DIAGNOSIS — C50411 Malignant neoplasm of upper-outer quadrant of right female breast: Secondary | ICD-10-CM | POA: Diagnosis not present

## 2018-07-09 DIAGNOSIS — Z170 Estrogen receptor positive status [ER+]: Secondary | ICD-10-CM | POA: Diagnosis not present

## 2018-07-10 ENCOUNTER — Ambulatory Visit
Admission: RE | Admit: 2018-07-10 | Discharge: 2018-07-10 | Disposition: A | Discharge status: Home / Self Care | Payer: Medicare Other | Source: Ambulatory Visit | Attending: Radiation Oncology | Admitting: Radiation Oncology

## 2018-07-10 ENCOUNTER — Other Ambulatory Visit: Payer: Self-pay

## 2018-07-10 DIAGNOSIS — C50411 Malignant neoplasm of upper-outer quadrant of right female breast: Secondary | ICD-10-CM | POA: Diagnosis not present

## 2018-07-10 DIAGNOSIS — Z79899 Other long term (current) drug therapy: Secondary | ICD-10-CM | POA: Diagnosis not present

## 2018-07-10 DIAGNOSIS — Z510 Encounter for antineoplastic radiation therapy: Secondary | ICD-10-CM | POA: Diagnosis not present

## 2018-07-10 DIAGNOSIS — Z170 Estrogen receptor positive status [ER+]: Secondary | ICD-10-CM | POA: Diagnosis not present

## 2018-07-11 ENCOUNTER — Ambulatory Visit
Admission: RE | Admit: 2018-07-11 | Discharge: 2018-07-11 | Disposition: A | Discharge status: Home / Self Care | Payer: Medicare Other | Source: Ambulatory Visit | Attending: Radiation Oncology | Admitting: Radiation Oncology

## 2018-07-11 ENCOUNTER — Other Ambulatory Visit: Payer: Self-pay

## 2018-07-11 DIAGNOSIS — Z79899 Other long term (current) drug therapy: Secondary | ICD-10-CM | POA: Insufficient documentation

## 2018-07-11 DIAGNOSIS — Z170 Estrogen receptor positive status [ER+]: Secondary | ICD-10-CM | POA: Insufficient documentation

## 2018-07-11 DIAGNOSIS — C50411 Malignant neoplasm of upper-outer quadrant of right female breast: Secondary | ICD-10-CM | POA: Insufficient documentation

## 2018-07-11 DIAGNOSIS — Z510 Encounter for antineoplastic radiation therapy: Secondary | ICD-10-CM | POA: Insufficient documentation

## 2018-07-12 ENCOUNTER — Ambulatory Visit
Admission: RE | Admit: 2018-07-12 | Discharge: 2018-07-12 | Disposition: A | Discharge status: Home / Self Care | Payer: Medicare Other | Source: Ambulatory Visit | Attending: Radiation Oncology | Admitting: Radiation Oncology

## 2018-07-12 ENCOUNTER — Other Ambulatory Visit: Payer: Self-pay

## 2018-07-12 DIAGNOSIS — Z79899 Other long term (current) drug therapy: Secondary | ICD-10-CM | POA: Diagnosis not present

## 2018-07-12 DIAGNOSIS — Z170 Estrogen receptor positive status [ER+]: Secondary | ICD-10-CM | POA: Diagnosis not present

## 2018-07-12 DIAGNOSIS — C50411 Malignant neoplasm of upper-outer quadrant of right female breast: Secondary | ICD-10-CM | POA: Diagnosis not present

## 2018-07-12 DIAGNOSIS — Z510 Encounter for antineoplastic radiation therapy: Secondary | ICD-10-CM | POA: Diagnosis not present

## 2018-07-13 ENCOUNTER — Ambulatory Visit
Admission: RE | Admit: 2018-07-13 | Discharge: 2018-07-13 | Disposition: A | Discharge status: Home / Self Care | Payer: Medicare Other | Source: Ambulatory Visit | Attending: Radiation Oncology | Admitting: Radiation Oncology

## 2018-07-13 ENCOUNTER — Other Ambulatory Visit: Payer: Self-pay

## 2018-07-13 DIAGNOSIS — Z79899 Other long term (current) drug therapy: Secondary | ICD-10-CM | POA: Diagnosis not present

## 2018-07-13 DIAGNOSIS — C50411 Malignant neoplasm of upper-outer quadrant of right female breast: Secondary | ICD-10-CM | POA: Diagnosis not present

## 2018-07-13 DIAGNOSIS — Z510 Encounter for antineoplastic radiation therapy: Secondary | ICD-10-CM | POA: Diagnosis not present

## 2018-07-13 DIAGNOSIS — Z170 Estrogen receptor positive status [ER+]: Secondary | ICD-10-CM | POA: Diagnosis not present

## 2018-07-16 ENCOUNTER — Other Ambulatory Visit: Payer: Self-pay

## 2018-07-16 ENCOUNTER — Ambulatory Visit
Admission: RE | Admit: 2018-07-16 | Discharge: 2018-07-16 | Disposition: A | Discharge status: Home / Self Care | Payer: Medicare Other | Source: Ambulatory Visit | Attending: Radiation Oncology | Admitting: Radiation Oncology

## 2018-07-16 DIAGNOSIS — Z510 Encounter for antineoplastic radiation therapy: Secondary | ICD-10-CM | POA: Diagnosis not present

## 2018-07-16 DIAGNOSIS — Z170 Estrogen receptor positive status [ER+]: Secondary | ICD-10-CM | POA: Diagnosis not present

## 2018-07-16 DIAGNOSIS — Z79899 Other long term (current) drug therapy: Secondary | ICD-10-CM | POA: Diagnosis not present

## 2018-07-16 DIAGNOSIS — C50411 Malignant neoplasm of upper-outer quadrant of right female breast: Secondary | ICD-10-CM | POA: Diagnosis not present

## 2018-07-17 ENCOUNTER — Other Ambulatory Visit: Payer: Self-pay

## 2018-07-17 ENCOUNTER — Ambulatory Visit
Admission: RE | Admit: 2018-07-17 | Discharge: 2018-07-17 | Disposition: A | Discharge status: Home / Self Care | Payer: Medicare Other | Source: Ambulatory Visit | Attending: Radiation Oncology | Admitting: Radiation Oncology

## 2018-07-17 DIAGNOSIS — Z79899 Other long term (current) drug therapy: Secondary | ICD-10-CM | POA: Diagnosis not present

## 2018-07-17 DIAGNOSIS — Z170 Estrogen receptor positive status [ER+]: Secondary | ICD-10-CM | POA: Diagnosis not present

## 2018-07-17 DIAGNOSIS — C50411 Malignant neoplasm of upper-outer quadrant of right female breast: Secondary | ICD-10-CM | POA: Diagnosis not present

## 2018-07-17 DIAGNOSIS — Z510 Encounter for antineoplastic radiation therapy: Secondary | ICD-10-CM | POA: Diagnosis not present

## 2018-07-18 ENCOUNTER — Ambulatory Visit
Admission: RE | Admit: 2018-07-18 | Discharge: 2018-07-18 | Disposition: A | Discharge status: Home / Self Care | Payer: Medicare Other | Source: Ambulatory Visit | Attending: Radiation Oncology | Admitting: Radiation Oncology

## 2018-07-18 ENCOUNTER — Other Ambulatory Visit: Payer: Self-pay

## 2018-07-18 DIAGNOSIS — Z79899 Other long term (current) drug therapy: Secondary | ICD-10-CM | POA: Diagnosis not present

## 2018-07-18 DIAGNOSIS — Z170 Estrogen receptor positive status [ER+]: Secondary | ICD-10-CM | POA: Diagnosis not present

## 2018-07-18 DIAGNOSIS — C50411 Malignant neoplasm of upper-outer quadrant of right female breast: Secondary | ICD-10-CM | POA: Diagnosis not present

## 2018-07-18 DIAGNOSIS — Z510 Encounter for antineoplastic radiation therapy: Secondary | ICD-10-CM | POA: Diagnosis not present

## 2018-07-19 ENCOUNTER — Ambulatory Visit
Admission: RE | Admit: 2018-07-19 | Discharge: 2018-07-19 | Disposition: A | Discharge status: Home / Self Care | Payer: Medicare Other | Source: Ambulatory Visit | Attending: Radiation Oncology | Admitting: Radiation Oncology

## 2018-07-19 ENCOUNTER — Other Ambulatory Visit: Payer: Self-pay

## 2018-07-19 DIAGNOSIS — Z510 Encounter for antineoplastic radiation therapy: Secondary | ICD-10-CM | POA: Diagnosis not present

## 2018-07-19 DIAGNOSIS — C50411 Malignant neoplasm of upper-outer quadrant of right female breast: Secondary | ICD-10-CM | POA: Diagnosis not present

## 2018-07-19 DIAGNOSIS — Z170 Estrogen receptor positive status [ER+]: Secondary | ICD-10-CM | POA: Diagnosis not present

## 2018-07-19 DIAGNOSIS — Z79899 Other long term (current) drug therapy: Secondary | ICD-10-CM | POA: Diagnosis not present

## 2018-07-20 ENCOUNTER — Ambulatory Visit
Admission: RE | Admit: 2018-07-20 | Discharge: 2018-07-20 | Disposition: A | Discharge status: Home / Self Care | Payer: Medicare Other | Source: Ambulatory Visit | Attending: Radiation Oncology | Admitting: Radiation Oncology

## 2018-07-20 ENCOUNTER — Other Ambulatory Visit: Payer: Self-pay

## 2018-07-20 DIAGNOSIS — C50411 Malignant neoplasm of upper-outer quadrant of right female breast: Secondary | ICD-10-CM | POA: Diagnosis not present

## 2018-07-20 DIAGNOSIS — Z510 Encounter for antineoplastic radiation therapy: Secondary | ICD-10-CM | POA: Diagnosis not present

## 2018-07-20 DIAGNOSIS — Z170 Estrogen receptor positive status [ER+]: Secondary | ICD-10-CM | POA: Diagnosis not present

## 2018-07-20 DIAGNOSIS — Z79899 Other long term (current) drug therapy: Secondary | ICD-10-CM | POA: Diagnosis not present

## 2018-07-23 ENCOUNTER — Ambulatory Visit
Admission: RE | Admit: 2018-07-23 | Discharge: 2018-07-23 | Disposition: A | Discharge status: Home / Self Care | Payer: Medicare Other | Source: Ambulatory Visit | Attending: Radiation Oncology | Admitting: Radiation Oncology

## 2018-07-23 ENCOUNTER — Other Ambulatory Visit: Payer: Self-pay

## 2018-07-23 DIAGNOSIS — Z79899 Other long term (current) drug therapy: Secondary | ICD-10-CM | POA: Diagnosis not present

## 2018-07-23 DIAGNOSIS — Z170 Estrogen receptor positive status [ER+]: Secondary | ICD-10-CM | POA: Diagnosis not present

## 2018-07-23 DIAGNOSIS — Z510 Encounter for antineoplastic radiation therapy: Secondary | ICD-10-CM | POA: Diagnosis not present

## 2018-07-23 DIAGNOSIS — C50411 Malignant neoplasm of upper-outer quadrant of right female breast: Secondary | ICD-10-CM | POA: Diagnosis not present

## 2018-07-24 ENCOUNTER — Other Ambulatory Visit: Payer: Self-pay

## 2018-07-24 ENCOUNTER — Ambulatory Visit
Admission: RE | Admit: 2018-07-24 | Discharge: 2018-07-24 | Disposition: A | Discharge status: Home / Self Care | Payer: Medicare Other | Source: Ambulatory Visit | Attending: Radiation Oncology | Admitting: Radiation Oncology

## 2018-07-24 DIAGNOSIS — Z79899 Other long term (current) drug therapy: Secondary | ICD-10-CM | POA: Diagnosis not present

## 2018-07-24 DIAGNOSIS — Z170 Estrogen receptor positive status [ER+]: Secondary | ICD-10-CM | POA: Diagnosis not present

## 2018-07-24 DIAGNOSIS — C50411 Malignant neoplasm of upper-outer quadrant of right female breast: Secondary | ICD-10-CM | POA: Diagnosis not present

## 2018-07-24 DIAGNOSIS — Z510 Encounter for antineoplastic radiation therapy: Secondary | ICD-10-CM | POA: Diagnosis not present

## 2018-07-25 ENCOUNTER — Other Ambulatory Visit: Payer: Self-pay

## 2018-07-25 ENCOUNTER — Ambulatory Visit
Admission: RE | Admit: 2018-07-25 | Discharge: 2018-07-25 | Disposition: A | Discharge status: Home / Self Care | Payer: Medicare Other | Source: Ambulatory Visit | Attending: Radiation Oncology | Admitting: Radiation Oncology

## 2018-07-25 DIAGNOSIS — Z510 Encounter for antineoplastic radiation therapy: Secondary | ICD-10-CM | POA: Diagnosis not present

## 2018-07-25 DIAGNOSIS — Z79899 Other long term (current) drug therapy: Secondary | ICD-10-CM | POA: Diagnosis not present

## 2018-07-25 DIAGNOSIS — Z170 Estrogen receptor positive status [ER+]: Secondary | ICD-10-CM | POA: Diagnosis not present

## 2018-07-25 DIAGNOSIS — C50411 Malignant neoplasm of upper-outer quadrant of right female breast: Secondary | ICD-10-CM | POA: Diagnosis not present

## 2018-07-26 ENCOUNTER — Ambulatory Visit
Admission: RE | Admit: 2018-07-26 | Discharge: 2018-07-26 | Disposition: A | Discharge status: Home / Self Care | Payer: Medicare Other | Source: Ambulatory Visit | Attending: Radiation Oncology | Admitting: Radiation Oncology

## 2018-07-26 ENCOUNTER — Other Ambulatory Visit: Payer: Self-pay

## 2018-07-26 DIAGNOSIS — C50411 Malignant neoplasm of upper-outer quadrant of right female breast: Secondary | ICD-10-CM | POA: Diagnosis not present

## 2018-07-26 DIAGNOSIS — Z79899 Other long term (current) drug therapy: Secondary | ICD-10-CM | POA: Diagnosis not present

## 2018-07-26 DIAGNOSIS — Z170 Estrogen receptor positive status [ER+]: Secondary | ICD-10-CM | POA: Diagnosis not present

## 2018-07-26 DIAGNOSIS — Z510 Encounter for antineoplastic radiation therapy: Secondary | ICD-10-CM | POA: Diagnosis not present

## 2018-07-27 ENCOUNTER — Ambulatory Visit
Admission: RE | Admit: 2018-07-27 | Discharge: 2018-07-27 | Disposition: A | Discharge status: Home / Self Care | Payer: Medicare Other | Source: Ambulatory Visit | Attending: Radiation Oncology | Admitting: Radiation Oncology

## 2018-07-27 ENCOUNTER — Other Ambulatory Visit: Payer: Self-pay

## 2018-07-27 DIAGNOSIS — Z79899 Other long term (current) drug therapy: Secondary | ICD-10-CM | POA: Diagnosis not present

## 2018-07-27 DIAGNOSIS — Z170 Estrogen receptor positive status [ER+]: Secondary | ICD-10-CM | POA: Diagnosis not present

## 2018-07-27 DIAGNOSIS — Z510 Encounter for antineoplastic radiation therapy: Secondary | ICD-10-CM | POA: Diagnosis not present

## 2018-07-27 DIAGNOSIS — C50411 Malignant neoplasm of upper-outer quadrant of right female breast: Secondary | ICD-10-CM | POA: Diagnosis not present

## 2018-07-30 ENCOUNTER — Other Ambulatory Visit: Payer: Self-pay

## 2018-07-30 ENCOUNTER — Ambulatory Visit
Admission: RE | Admit: 2018-07-30 | Discharge: 2018-07-30 | Disposition: A | Discharge status: Home / Self Care | Payer: Medicare Other | Source: Ambulatory Visit | Attending: Radiation Oncology | Admitting: Radiation Oncology

## 2018-07-30 ENCOUNTER — Ambulatory Visit: Payer: Medicare Other | Admitting: Family Medicine

## 2018-07-30 DIAGNOSIS — C50411 Malignant neoplasm of upper-outer quadrant of right female breast: Secondary | ICD-10-CM | POA: Diagnosis not present

## 2018-07-30 DIAGNOSIS — Z79899 Other long term (current) drug therapy: Secondary | ICD-10-CM | POA: Diagnosis not present

## 2018-07-30 DIAGNOSIS — Z510 Encounter for antineoplastic radiation therapy: Secondary | ICD-10-CM | POA: Diagnosis not present

## 2018-07-30 DIAGNOSIS — Z170 Estrogen receptor positive status [ER+]: Secondary | ICD-10-CM | POA: Diagnosis not present

## 2018-07-31 ENCOUNTER — Other Ambulatory Visit: Payer: Self-pay

## 2018-07-31 ENCOUNTER — Ambulatory Visit
Admission: RE | Admit: 2018-07-31 | Discharge: 2018-07-31 | Disposition: A | Discharge status: Home / Self Care | Payer: Medicare Other | Source: Ambulatory Visit | Attending: Radiation Oncology | Admitting: Radiation Oncology

## 2018-07-31 DIAGNOSIS — Z79899 Other long term (current) drug therapy: Secondary | ICD-10-CM | POA: Diagnosis not present

## 2018-07-31 DIAGNOSIS — Z170 Estrogen receptor positive status [ER+]: Secondary | ICD-10-CM | POA: Diagnosis not present

## 2018-07-31 DIAGNOSIS — Z510 Encounter for antineoplastic radiation therapy: Secondary | ICD-10-CM | POA: Diagnosis not present

## 2018-07-31 DIAGNOSIS — C50411 Malignant neoplasm of upper-outer quadrant of right female breast: Secondary | ICD-10-CM | POA: Diagnosis not present

## 2018-08-01 ENCOUNTER — Ambulatory Visit: Payer: Medicare Other

## 2018-08-02 ENCOUNTER — Ambulatory Visit: Payer: Medicare Other | Admitting: Family Medicine

## 2018-08-02 ENCOUNTER — Ambulatory Visit
Admission: RE | Admit: 2018-08-02 | Discharge: 2018-08-02 | Disposition: A | Discharge status: Home / Self Care | Payer: Medicare Other | Source: Ambulatory Visit | Attending: Radiation Oncology | Admitting: Radiation Oncology

## 2018-08-02 ENCOUNTER — Other Ambulatory Visit: Payer: Self-pay

## 2018-08-02 DIAGNOSIS — C50411 Malignant neoplasm of upper-outer quadrant of right female breast: Secondary | ICD-10-CM | POA: Diagnosis not present

## 2018-08-02 DIAGNOSIS — Z170 Estrogen receptor positive status [ER+]: Secondary | ICD-10-CM | POA: Diagnosis not present

## 2018-08-02 DIAGNOSIS — Z510 Encounter for antineoplastic radiation therapy: Secondary | ICD-10-CM | POA: Diagnosis not present

## 2018-08-02 DIAGNOSIS — Z79899 Other long term (current) drug therapy: Secondary | ICD-10-CM | POA: Diagnosis not present

## 2018-08-03 ENCOUNTER — Ambulatory Visit
Admission: RE | Admit: 2018-08-03 | Discharge: 2018-08-03 | Disposition: A | Discharge status: Home / Self Care | Payer: Medicare Other | Source: Ambulatory Visit | Attending: Radiation Oncology | Admitting: Radiation Oncology

## 2018-08-03 ENCOUNTER — Other Ambulatory Visit: Payer: Self-pay

## 2018-08-03 DIAGNOSIS — Z170 Estrogen receptor positive status [ER+]: Secondary | ICD-10-CM | POA: Diagnosis not present

## 2018-08-03 DIAGNOSIS — C50411 Malignant neoplasm of upper-outer quadrant of right female breast: Secondary | ICD-10-CM | POA: Diagnosis not present

## 2018-08-03 DIAGNOSIS — Z79899 Other long term (current) drug therapy: Secondary | ICD-10-CM | POA: Diagnosis not present

## 2018-08-03 DIAGNOSIS — Z510 Encounter for antineoplastic radiation therapy: Secondary | ICD-10-CM | POA: Diagnosis not present

## 2018-08-06 ENCOUNTER — Ambulatory Visit
Admission: RE | Admit: 2018-08-06 | Discharge: 2018-08-06 | Disposition: A | Discharge status: Home / Self Care | Payer: Medicare Other | Source: Ambulatory Visit | Attending: Radiation Oncology | Admitting: Radiation Oncology

## 2018-08-06 ENCOUNTER — Other Ambulatory Visit: Payer: Self-pay

## 2018-08-06 DIAGNOSIS — Z79899 Other long term (current) drug therapy: Secondary | ICD-10-CM | POA: Diagnosis not present

## 2018-08-06 DIAGNOSIS — Z510 Encounter for antineoplastic radiation therapy: Secondary | ICD-10-CM | POA: Diagnosis not present

## 2018-08-06 DIAGNOSIS — C50411 Malignant neoplasm of upper-outer quadrant of right female breast: Secondary | ICD-10-CM | POA: Diagnosis not present

## 2018-08-06 DIAGNOSIS — Z170 Estrogen receptor positive status [ER+]: Secondary | ICD-10-CM | POA: Diagnosis not present

## 2018-08-07 ENCOUNTER — Ambulatory Visit
Admission: RE | Admit: 2018-08-07 | Discharge: 2018-08-07 | Disposition: A | Discharge status: Home / Self Care | Payer: Medicare Other | Source: Ambulatory Visit | Attending: Radiation Oncology | Admitting: Radiation Oncology

## 2018-08-07 ENCOUNTER — Other Ambulatory Visit: Payer: Self-pay

## 2018-08-07 DIAGNOSIS — Z170 Estrogen receptor positive status [ER+]: Secondary | ICD-10-CM | POA: Diagnosis not present

## 2018-08-07 DIAGNOSIS — Z79899 Other long term (current) drug therapy: Secondary | ICD-10-CM | POA: Diagnosis not present

## 2018-08-07 DIAGNOSIS — C50411 Malignant neoplasm of upper-outer quadrant of right female breast: Secondary | ICD-10-CM | POA: Diagnosis not present

## 2018-08-07 DIAGNOSIS — Z510 Encounter for antineoplastic radiation therapy: Secondary | ICD-10-CM | POA: Diagnosis not present

## 2018-08-08 ENCOUNTER — Ambulatory Visit
Admission: RE | Admit: 2018-08-08 | Discharge: 2018-08-08 | Disposition: A | Discharge status: Home / Self Care | Payer: Medicare Other | Source: Ambulatory Visit | Attending: Radiation Oncology | Admitting: Radiation Oncology

## 2018-08-08 ENCOUNTER — Other Ambulatory Visit: Payer: Self-pay

## 2018-08-08 DIAGNOSIS — Z79899 Other long term (current) drug therapy: Secondary | ICD-10-CM | POA: Diagnosis not present

## 2018-08-08 DIAGNOSIS — Z510 Encounter for antineoplastic radiation therapy: Secondary | ICD-10-CM | POA: Diagnosis not present

## 2018-08-08 DIAGNOSIS — C50411 Malignant neoplasm of upper-outer quadrant of right female breast: Secondary | ICD-10-CM | POA: Diagnosis not present

## 2018-08-08 DIAGNOSIS — Z170 Estrogen receptor positive status [ER+]: Secondary | ICD-10-CM | POA: Diagnosis not present

## 2018-08-09 ENCOUNTER — Ambulatory Visit
Admission: RE | Admit: 2018-08-09 | Discharge: 2018-08-09 | Disposition: A | Discharge status: Home / Self Care | Payer: Medicare Other | Source: Ambulatory Visit | Attending: Radiation Oncology | Admitting: Radiation Oncology

## 2018-08-09 ENCOUNTER — Other Ambulatory Visit: Payer: Self-pay

## 2018-08-09 DIAGNOSIS — C50411 Malignant neoplasm of upper-outer quadrant of right female breast: Secondary | ICD-10-CM | POA: Diagnosis not present

## 2018-08-09 DIAGNOSIS — Z510 Encounter for antineoplastic radiation therapy: Secondary | ICD-10-CM | POA: Diagnosis not present

## 2018-08-09 DIAGNOSIS — Z79899 Other long term (current) drug therapy: Secondary | ICD-10-CM | POA: Diagnosis not present

## 2018-08-09 DIAGNOSIS — Z170 Estrogen receptor positive status [ER+]: Secondary | ICD-10-CM | POA: Diagnosis not present

## 2018-08-10 ENCOUNTER — Ambulatory Visit
Admission: RE | Admit: 2018-08-10 | Discharge: 2018-08-10 | Disposition: A | Discharge status: Home / Self Care | Payer: Medicare Other | Source: Ambulatory Visit | Attending: Radiation Oncology | Admitting: Radiation Oncology

## 2018-08-10 ENCOUNTER — Ambulatory Visit: Payer: Medicare Other

## 2018-08-10 ENCOUNTER — Other Ambulatory Visit: Payer: Self-pay

## 2018-08-10 DIAGNOSIS — Z510 Encounter for antineoplastic radiation therapy: Secondary | ICD-10-CM | POA: Insufficient documentation

## 2018-08-10 DIAGNOSIS — C50411 Malignant neoplasm of upper-outer quadrant of right female breast: Secondary | ICD-10-CM | POA: Insufficient documentation

## 2018-08-10 DIAGNOSIS — Z170 Estrogen receptor positive status [ER+]: Secondary | ICD-10-CM | POA: Diagnosis not present

## 2018-08-10 DIAGNOSIS — Z79899 Other long term (current) drug therapy: Secondary | ICD-10-CM | POA: Diagnosis not present

## 2018-08-13 ENCOUNTER — Ambulatory Visit: Payer: Medicare Other

## 2018-08-13 ENCOUNTER — Other Ambulatory Visit: Payer: Self-pay

## 2018-08-13 ENCOUNTER — Ambulatory Visit
Admission: RE | Admit: 2018-08-13 | Discharge: 2018-08-13 | Disposition: A | Discharge status: Home / Self Care | Payer: Medicare Other | Source: Ambulatory Visit | Attending: Radiation Oncology | Admitting: Radiation Oncology

## 2018-08-13 DIAGNOSIS — Z510 Encounter for antineoplastic radiation therapy: Secondary | ICD-10-CM | POA: Diagnosis not present

## 2018-08-13 DIAGNOSIS — Z170 Estrogen receptor positive status [ER+]: Secondary | ICD-10-CM | POA: Diagnosis not present

## 2018-08-13 DIAGNOSIS — Z79899 Other long term (current) drug therapy: Secondary | ICD-10-CM | POA: Diagnosis not present

## 2018-08-13 DIAGNOSIS — C50411 Malignant neoplasm of upper-outer quadrant of right female breast: Secondary | ICD-10-CM | POA: Diagnosis not present

## 2018-08-14 ENCOUNTER — Other Ambulatory Visit: Payer: Self-pay

## 2018-08-14 ENCOUNTER — Ambulatory Visit
Admission: RE | Admit: 2018-08-14 | Discharge: 2018-08-14 | Disposition: A | Discharge status: Home / Self Care | Payer: Medicare Other | Source: Ambulatory Visit | Attending: Radiation Oncology | Admitting: Radiation Oncology

## 2018-08-14 DIAGNOSIS — Z510 Encounter for antineoplastic radiation therapy: Secondary | ICD-10-CM | POA: Diagnosis not present

## 2018-08-14 DIAGNOSIS — Z79899 Other long term (current) drug therapy: Secondary | ICD-10-CM | POA: Diagnosis not present

## 2018-08-14 DIAGNOSIS — Z170 Estrogen receptor positive status [ER+]: Secondary | ICD-10-CM | POA: Diagnosis not present

## 2018-08-14 DIAGNOSIS — C50411 Malignant neoplasm of upper-outer quadrant of right female breast: Secondary | ICD-10-CM | POA: Diagnosis not present

## 2018-08-15 ENCOUNTER — Other Ambulatory Visit: Payer: Self-pay

## 2018-08-15 ENCOUNTER — Ambulatory Visit
Admission: RE | Admit: 2018-08-15 | Discharge: 2018-08-15 | Disposition: A | Discharge status: Home / Self Care | Payer: Medicare Other | Source: Ambulatory Visit | Attending: Radiation Oncology | Admitting: Radiation Oncology

## 2018-08-15 DIAGNOSIS — Z170 Estrogen receptor positive status [ER+]: Secondary | ICD-10-CM | POA: Diagnosis not present

## 2018-08-15 DIAGNOSIS — Z510 Encounter for antineoplastic radiation therapy: Secondary | ICD-10-CM | POA: Diagnosis not present

## 2018-08-15 DIAGNOSIS — C50411 Malignant neoplasm of upper-outer quadrant of right female breast: Secondary | ICD-10-CM | POA: Diagnosis not present

## 2018-08-15 DIAGNOSIS — Z79899 Other long term (current) drug therapy: Secondary | ICD-10-CM | POA: Diagnosis not present

## 2018-08-16 ENCOUNTER — Other Ambulatory Visit: Payer: Self-pay

## 2018-08-16 ENCOUNTER — Ambulatory Visit
Admission: RE | Admit: 2018-08-16 | Discharge: 2018-08-16 | Disposition: A | Discharge status: Home / Self Care | Payer: Medicare Other | Source: Ambulatory Visit | Attending: Radiation Oncology | Admitting: Radiation Oncology

## 2018-08-16 DIAGNOSIS — C50411 Malignant neoplasm of upper-outer quadrant of right female breast: Secondary | ICD-10-CM | POA: Diagnosis not present

## 2018-08-16 DIAGNOSIS — Z170 Estrogen receptor positive status [ER+]: Secondary | ICD-10-CM | POA: Diagnosis not present

## 2018-08-16 DIAGNOSIS — Z510 Encounter for antineoplastic radiation therapy: Secondary | ICD-10-CM | POA: Diagnosis not present

## 2018-08-16 DIAGNOSIS — Z79899 Other long term (current) drug therapy: Secondary | ICD-10-CM | POA: Diagnosis not present

## 2018-08-17 ENCOUNTER — Other Ambulatory Visit: Payer: Self-pay

## 2018-08-17 ENCOUNTER — Ambulatory Visit: Payer: Medicare Other

## 2018-08-17 ENCOUNTER — Ambulatory Visit
Admission: RE | Admit: 2018-08-17 | Discharge: 2018-08-17 | Disposition: A | Discharge status: Home / Self Care | Payer: Medicare Other | Source: Ambulatory Visit | Attending: Radiation Oncology | Admitting: Radiation Oncology

## 2018-08-17 DIAGNOSIS — Z170 Estrogen receptor positive status [ER+]: Secondary | ICD-10-CM | POA: Diagnosis not present

## 2018-08-17 DIAGNOSIS — Z79899 Other long term (current) drug therapy: Secondary | ICD-10-CM | POA: Diagnosis not present

## 2018-08-17 DIAGNOSIS — Z510 Encounter for antineoplastic radiation therapy: Secondary | ICD-10-CM | POA: Diagnosis not present

## 2018-08-17 DIAGNOSIS — C50411 Malignant neoplasm of upper-outer quadrant of right female breast: Secondary | ICD-10-CM | POA: Diagnosis not present

## 2018-08-20 ENCOUNTER — Other Ambulatory Visit: Payer: Self-pay

## 2018-08-20 ENCOUNTER — Encounter: Payer: Self-pay | Admitting: Radiation Oncology

## 2018-08-20 ENCOUNTER — Ambulatory Visit
Admission: RE | Admit: 2018-08-20 | Discharge: 2018-08-20 | Disposition: A | Discharge status: Home / Self Care | Payer: Medicare Other | Source: Ambulatory Visit | Attending: Radiation Oncology | Admitting: Radiation Oncology

## 2018-08-20 DIAGNOSIS — Z79899 Other long term (current) drug therapy: Secondary | ICD-10-CM | POA: Diagnosis not present

## 2018-08-20 DIAGNOSIS — Z510 Encounter for antineoplastic radiation therapy: Secondary | ICD-10-CM | POA: Diagnosis not present

## 2018-08-20 DIAGNOSIS — Z170 Estrogen receptor positive status [ER+]: Secondary | ICD-10-CM | POA: Diagnosis not present

## 2018-08-20 DIAGNOSIS — C50411 Malignant neoplasm of upper-outer quadrant of right female breast: Secondary | ICD-10-CM | POA: Diagnosis not present

## 2018-08-20 NOTE — Progress Notes (Signed)
  Radiation Oncology         [phone]) [phone] ________________________________  Name: Kristina Bridges MRN: [id number]  Date: 08/20/2018  DOB: [birth year]  End of Treatment Note  Diagnosis:   Stage IB (pT2, pN1a, cM0) Right Breast UOQ Invasive Ductal Carcinoma, ER+ / PR+ / Her2-, grade 2     Indication for treatment:  Curative       Radiation treatment dates:   07/03/18-08/20/18  Site/dose:   1. Right breast; total of 50.4 Gy in 28 fractions          2. Boost to right breast; total of 12 Gy in 6 fractions           3. Right axilla; total of 45 Gy in 25 fractions   Beams/energy:   1. 3D Photon; 6X        2. 3D Photon; 6X        3. 3D Photon; 6X  Narrative: The patient tolerated radiation treatment relatively well.     Pt denied pain and fatigue throughout treatments. Pt developed right breast hyperpigmentation changes, no skin breakdown. She endorsed using Radiaplex as prescribed. Overall the pt was without complaints.   Plan: The patient has completed radiation treatment. The patient will return to radiation oncology clinic for routine followup in one month. I advised them to call or return sooner if they have any questions or concerns related to their recovery or treatment.  -----------------------------------  Blair Promise, PhD, MD  This document serves as a record of services personally performed by Gery Pray, MD. It was created on his behalf by Mary-Margaret Loma Messing, a trained medical scribe. The creation of this record is based on the scribe's personal observations and the provider's statements to them. This document has been checked and approved by the attending provider.

## 2018-09-20 ENCOUNTER — Encounter: Payer: Self-pay | Admitting: Radiation Oncology

## 2018-09-20 ENCOUNTER — Other Ambulatory Visit: Payer: Self-pay

## 2018-09-20 ENCOUNTER — Ambulatory Visit
Admission: RE | Admit: 2018-09-20 | Discharge: 2018-09-20 | Disposition: A | Discharge status: Home / Self Care | Payer: Medicare Other | Source: Ambulatory Visit | Attending: Radiation Oncology | Admitting: Radiation Oncology

## 2018-09-20 VITALS — BP 155/64 | HR 67 | Temp 98.3°F | Resp 18 | Ht 65.0 in | Wt 126.4 lb

## 2018-09-20 DIAGNOSIS — C50411 Malignant neoplasm of upper-outer quadrant of right female breast: Secondary | ICD-10-CM

## 2018-09-20 DIAGNOSIS — L299 Pruritus, unspecified: Secondary | ICD-10-CM | POA: Insufficient documentation

## 2018-09-20 DIAGNOSIS — Z79899 Other long term (current) drug therapy: Secondary | ICD-10-CM | POA: Insufficient documentation

## 2018-09-20 DIAGNOSIS — Z923 Personal history of irradiation: Secondary | ICD-10-CM | POA: Diagnosis not present

## 2018-09-20 DIAGNOSIS — Z853 Personal history of malignant neoplasm of breast: Secondary | ICD-10-CM | POA: Insufficient documentation

## 2018-09-20 NOTE — Patient Instructions (Signed)
Coronavirus (COVID-19) Are you at risk?  Are you at risk for the Coronavirus (COVID-19)?  To be considered HIGH RISK for Coronavirus (COVID-19), you have to meet the following criteria:  . Traveled to [place], [place], [place], [place] or [place]; or in the [place] to [place], [place], [place], or [place]; and have fever, cough, and shortness of breath within the last 2 weeks of travel OR . Been in close contact with a person diagnosed with COVID-19 within the last 2 weeks and have fever, cough, and shortness of breath . IF YOU DO NOT MEET THESE CRITERIA, YOU ARE CONSIDERED LOW RISK FOR COVID-19.  What to do if you are HIGH RISK for COVID-19?  . If you are having a medical emergency, call 911. . Seek medical care right away. Before you go to a doctor's office, urgent care or emergency department, call ahead and tell them about your recent travel, contact with someone diagnosed with COVID-19, and your symptoms. You should receive instructions from your physician's office regarding next steps of care.  . When you arrive at healthcare provider, tell the healthcare staff immediately you have returned from visiting [place], [place], [place], [place] or [place]; or traveled in the [place] to [place], [place], [place], or [place]; in the last two weeks or you have been in close contact with a person diagnosed with COVID-19 in the last 2 weeks.   . Tell the health care staff about your symptoms: fever, cough and shortness of breath. . After you have been seen by a medical provider, you will be either: o Tested for (COVID-19) and discharged home on quarantine except to seek medical care if symptoms worsen, and asked to  - Stay home and avoid contact with others until you get your results (4-5 days)  - Avoid travel on public transportation if possible (such as bus, train, or airplane) or o Sent to the Emergency Department by EMS for evaluation, COVID-19 testing, and possible  admission depending on your condition and test results.  What to do if you are LOW RISK for COVID-19?  Reduce your risk of any infection by using the same precautions used for avoiding the common cold or flu:  . Wash your hands often with soap and warm water for at least 20 seconds.  If soap and water are not readily available, use an alcohol-based hand sanitizer with at least 60% alcohol.  . If coughing or sneezing, cover your mouth and nose by coughing or sneezing into the elbow areas of your shirt or coat, into a tissue or into your sleeve (not your hands). . Avoid shaking hands with others and consider head nods or verbal greetings only. . Avoid touching your eyes, nose, or mouth with unwashed hands.  . Avoid close contact with people who are sick. . Avoid places or events with large numbers of people in one location, like concerts or sporting events. . Carefully consider travel plans you have or are making. . If you are planning any travel outside or inside the [place], visit the CDC's Travelers' Health webpage for the latest health notices. . If you have some symptoms but not all symptoms, continue to monitor at home and seek medical attention if your symptoms worsen. . If you are having a medical emergency, call 911.   ADDITIONAL HEALTHCARE OPTIONS FOR PATIENTS  [facility] / e-Visit: [email or web address]         [facility]: [phone]  [facility]: [phone]                   [facility]: [phone]   

## 2018-09-20 NOTE — Progress Notes (Signed)
  Radiation Oncology         [phone]) [phone] ________________________________  Name: Kristina Bridges MRN: [id number]  Date: 09/20/2018  DOB: Feb 23, 1941  Follow-Up Visit Note  CC: Lucretia Kern, DO  Stark Klein, MD    ICD-10-CM   1. Malignant neoplasm of upper-outer quadrant of right breast in female, estrogen receptor positive ([place])  C50.411    Z17.0     Diagnosis:   78 y.o. 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 Right breast / total of 50.4 Gy in 28 fractions 2. Boost to right breast / total of 12 Gy in 6 fractions 3. Right axilla / total of 45 Gy in 25 fractions   1992: Left breast, 6.5 weeks of radiation, Dr. Danny Lawless  Narrative:  The patient returns today for routine follow-up.  She denies any fatigue. She is still very active with her daily exercise routine. She reports using Radiaplex gel for her skin. She reports itching in axilla and at incision site. She reports hyperpigmentation is almost completely resolved.  ALLERGIES:  has No Known Allergies.  Meds: Current Outpatient Medications  Medication Sig Dispense Refill  . acetaminophen (TYLENOL) 500 MG tablet Take 500 mg by mouth as needed.    Marland Kitchen amLODipine (NORVASC) 5 MG tablet Take 1.5 tablets (7.5 mg total) by mouth daily. 135 tablet 1  . Multiple Vitamin (MULTIVITAMIN) capsule Take 1 capsule by mouth daily.     No current facility-administered medications for this encounter.     Physical Findings: The patient is in no acute distress. Patient is alert and oriented.  height is '5\' 5"'$  (1.651 m) and weight is 126 lb 6 oz (57.3 kg). Her temporal temperature is 98.3 F (36.8 C). Her blood pressure is 155/64 (abnormal) and her pulse is 67. Her respiration is 18 and oxygen saturation is 100%.   Lungs are clear to auscultation bilaterally. Heart has regular rate and rhythm. No  palpable cervical, supraclavicular, or axillary adenopathy. Abdomen soft, non-tender, normal bowel sounds.  Breast Exam Left Breast: Lumpectomy scar well-healed without any palpable or visible signs of recurrence. Right Breast: Patient's skin healed well. She has very slight hyperpigmentation changes. No dominant mass appreciated, nipple discharge or bleeding.  Lab Findings: Lab Results  Component Value Date   WBC 8.5 05/02/2018   HGB 13.7 05/02/2018   HCT 42.6 05/02/2018   MCV 93.8 05/02/2018   PLT 267 05/02/2018    Radiographic Findings: No results found.  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  No evidence of recurrence on clinical exam. Patient has recovered well from radiation therapy. No lasting side effects.   Plan:  PRN follow-up in radiation oncology. Patient will see Dr. Burr Medico next month to initiate adjuvant hormonal therapy.  ____________________________________  Blair Promise, PhD, MD  This document serves as a record of services personally performed by Gery Pray, MD. It was created on his behalf by Rae Lips, a trained medical scribe. The creation of this record is based on the scribe's personal observations and the provider's statements to them. This document has been checked and approved by the attending provider.

## 2018-09-20 NOTE — Progress Notes (Signed)
Pt presents today for f/u with Dr. Sondra Come. Pt denies c/o fatigue. Pt is still very active with daily exercise routine. Pt reports she has had family members move in with her due to their health concerns and Covid. Pt reports using Radiaplex. Pt reports itching in axilla and at incision site. Pt reports hyperpigmentation is almost completely resolved.  BP (!) 155/64 (BP Location: Left Arm, Patient Position: Sitting)   Pulse 67   Temp 98.3 F (36.8 C) (Temporal)   Resp 18   Ht 5\' 5"  (1.651 m)   Wt 126 lb 6 oz (57.3 kg)   SpO2 100%   BMI 21.03 kg/m   Wt Readings from Last 3 Encounters:  09/20/18 126 lb 6 oz (57.3 kg)  06/13/18 126 lb 3.2 oz (57.2 kg)  05/31/18 125 lb 14.4 oz (57.1 kg)   Loma Sousa, RN BSN

## 2018-10-22 NOTE — Progress Notes (Signed)
[facility]   Telephone:([phone] [phone]   Clinic Follow up Note   Patient Care Team: Lucretia Kern, DO as PCP - General (Family Medicine) Stark Klein, MD as Consulting Physician (General Surgery) Truitt Merle, MD as Consulting Physician (Hematology) Gery Pray, MD as Consulting Physician (Radiation Oncology)  Date of Service:  10/24/2018  CHIEF COMPLAINT: F/u of right breast cancer   SUMMARY OF ONCOLOGIC HISTORY: Oncology History Overview Note  Cancer Staging Malignant neoplasm of upper-outer quadrant of right breast in female, estrogen receptor positive ([place]) Staging form: Breast, AJCC 8th Edition - Clinical stage from 04/20/2018: Stage IIA (cT2, cN1, cM0, G1, ER+, PR+, HER2-) - Unsigned - Pathologic stage from 05/15/2018: Stage IB (pT2, pN1a, cM0, G2, ER+, PR+, HER2-) - Signed by Truitt Merle, MD on 05/25/2018     Malignant neoplasm of upper-outer quadrant of right breast in female, estrogen receptor positive ([place])  04/18/2018 Mammogram   Diagnostic [place] 04/18/18  IMPRESSION: Suspicious mass measuring 2.4 x 1.3 centimeters in the retroareolar region of the RIGHT breast  Enlarged RIGHT axillary lymph node.   04/20/2018 Initial Biopsy   Diagnosis 04/20/18 1. Breast, right, needle core biopsy, 11 o'clock - INVASIVE DUCTAL CARCINOMA. - SEE COMMENT. 2. Lymph node, needle/core biopsy, right axilla - METASTATIC CARCINOMA IN 1 OF 1 LYMPH NODE (1/1). - SEE COMMENT. Microscopic Comment 1. The carcinoma appears grade I. A breast prognostic profile will be performed and the results reported separately. 2. The carcinoma in part 2 is somewhat morphologically dissimilar from that seen in part 1. A breast prognostic profile will also be performed on part 2 and the results reported separately. The results were called to [facility] on 04/23/2018. (JBK:ecj 04/23/2018)   04/20/2018 Receptors her2   IMMUNOHISTOCHEMICAL AND MORPHOMETRIC  ANALYSIS PERFORMED MANUALLY The tumor cells are NEGATIVE for Her2 (1+). Estrogen Receptor: 100%, POSITIVE, STRONG STAINING INTENSITY Progesterone Receptor: 50%, POSITIVE, STRONG STAINING INTENSITY Proliferation Marker Ki67: 40%   04/27/2018 Initial Diagnosis   Malignant neoplasm of upper-outer quadrant of right breast in female, estrogen receptor positive ([place])   05/15/2018 Surgery   RIGHT BREAST LUMPECTOMY WITH RADIOACTIVE SEED AND RIGHT RADIOACTIVE SEED TARGETED LYMPH NODE BIOPSY AND RIGHT SENTINEL LYMPH NODE BIOPSY by Dr. Barry Dienes  05/15/18    05/15/2018 Pathology Results   Diagnosis 1. Breast, lumpectomy, Right w/seed - INVASIVE DUCTAL CARCINOMA, GRADE II, 3.7 CM. - DUCTAL CARCINOMA IN SITU, INTERMEDIATE NUCLEAR GRADE. - LOBULAR CARCINOMA IN SITU. - CARCINOMA IS 1 MM FROM THE POSTERIOR MARGIN AND 4 MM FROM THE SUPERIOR MARGIN. SEE NOTE - CARCINOMA FOCALLY INVOLVES SUBAREOLAR DERMIS BUT NIPPLE IS NOT INVOLVED. - DUCTAL CARCINOMA IN SITU IS 0.5 CM FROM THE MEDIAL MARGIN - NEGATIVE FOR LYMPHOVASCULAR OR PERINEURAL INVASION. - BIOPSY SITE CHANGES. - SEE ONCOLOGY TABLE 2. Lymph node, sentinel, biopsy, Right axillary 1&2 with seed - METASTATIC DUCTAL CARCINOMA TO A LYMPH NODE (1/1). - METASTATIC FOCUS MEASURES 2.3 CM AND SHOWS EXTRANODAL EXTENSION. 3. Lymph node, sentinel, biopsy, Right - LYMPH NODE, NEGATIVE FOR CARCINOMA (0/1). - IMMUNOSTAIN FOR PANKERATIN (AE1/AE3) IS NEGATIVE 4. Lymph node, sentinel, biopsy, Right axillary #3 - LYMPH NODE, NEGATIVE FOR CARCINOMA (0/1). - IMMUNOSTAIN FOR PANKERATIN (AE1/AE3) IS NEGATIVE    05/15/2018 Cancer Staging   Staging form: Breast, AJCC 8th Edition - Pathologic stage from 05/15/2018: Stage IB (pT2, pN1a, cM0, G2, ER+, PR+, HER2-) - Signed by Truitt Merle, MD on 05/25/2018   07/03/2018 - 08/20/2018 Radiation Therapy   adjuvant radiation 07/03/18-08/20/18  by Dr. Sondra Come   10/24/2018 -  Anti-estrogen oral therapy   Adjuvant Anastrozole 75m daily starting  10/24/18      CURRENT THERAPY:  Adjuvant Anastrozole 140mdaily starting 10/24/18  INTERVAL HISTORY:  [name] here for a follow up post radiation. She presents to the clinic alone. She notes she is doing well. She completed radiation which well and followed up with Rad Onc last week. She denied skin change.  She notes she never went through menopause symptoms. She started around 89o. She notes her concerns with frequent copay. She will see her new PCP Next week.     REVIEW OF SYSTEMS:   Constitutional: Denies fevers, chills or abnormal weight loss Eyes: Denies blurriness of vision Ears, nose, mouth, throat, and face: Denies mucositis or sore throat Respiratory: Denies cough, dyspnea or wheezes Cardiovascular: Denies palpitation, chest discomfort or lower extremity swelling Gastrointestinal:  Denies nausea, heartburn or change in bowel habits Skin: Denies abnormal skin rashes Lymphatics: Denies new lymphadenopathy or easy bruising Neurological:Denies numbness, tingling or new weaknesses Behavioral/Psych: Mood is stable, no new changes  All other systems were reviewed with the patient and are negative.  MEDICAL HISTORY:  Past Medical History:  Diagnosis Date  . Breast cancer [place], tx with surgery and radiation - no recurrence  . Chicken pox   . Family history of cervical cancer   . Family history of leukemia   . Family history of prostate cancer   . Family history of throat cancer   . Hypertension   . Personal history of radiation therapy   . Seasonal allergies     SURGICAL HISTORY: Past Surgical History:  Procedure Laterality Date  . BREAST BIOPSY  1992  . BREAST LUMPECTOMY Left   . BREAST LUMPECTOMY WITH RADIOACTIVE SEED AND SENTINEL LYMPH NODE BIOPSY Right 05/15/2018   Procedure: RIGHT BREAST LUMPECTOMY WITH RADIOACTIVE SEED AND RIGHT RADIOACTIVE SEED TARGETED LYMPH NODE BIOPSY AND RIGHT SENTINEL LYMPH NODE BIOPSY;  Surgeon: ByStark KleinMD;  [facility] Service: General;  Laterality: Right;    I have reviewed the social history and family history with the patient and they are unchanged from previous note.  ALLERGIES:  has No Known Allergies.  MEDICATIONS:  Current Outpatient Medications  Medication Sig Dispense Refill  . acetaminophen (TYLENOL) 500 MG tablet Take 500 mg by mouth as needed.    . Marland KitchenmLODipine (NORVASC) 5 MG tablet Take 1.5 tablets (7.5 mg total) by mouth daily. 135 tablet 1  . Multiple Vitamin (MULTIVITAMIN) capsule Take 1 capsule by mouth daily.    . Marland Kitchennastrozole (ARIMIDEX) 1 MG tablet Take 1 tablet (1 mg total) by mouth daily. 30 tablet 5   No current facility-administered medications for this visit.     PHYSICAL EXAMINATION: ECOG PERFORMANCE STATUS: 0 - Asymptomatic  Vitals:   10/24/18 1531  BP: (!) 168/83  Pulse: 74  Resp: 20  Temp: 98.9 F (37.2 C)  SpO2: 97%   Filed Weights   10/24/18 1531  Weight: 129 lb 14.4 oz (58.9 kg)    GENERAL:alert, no distress and comfortable SKIN: skin color, texture, turgor are normal, no rashes or significant lesions EYES: normal, Conjunctiva are pink and non-injected, sclera clear  NECK: supple, thyroid normal size, non-tender, without nodularity LYMPH:  no palpable lymphadenopathy in the cervical, axillary  LUNGS: clear to auscultation and percussion with normal breathing effort HEART: regular rate & rhythm and no murmurs and no lower  extremity edema ABDOMEN:abdomen soft, non-tender and normal bowel sounds Musculoskeletal:no cyanosis of digits and no clubbing  NEURO: alert & oriented x 3 with fluent speech, no focal motor/sensory deficits BREAST: (+) S/p b/l lumpectomy: surgical incisions healed well. No palpable mass, nodules or adenopathy bilaterally. Breast exam benign.   LABORATORY DATA:  I have reviewed the data as listed CBC Latest Ref Rng & Units 10/24/2018 05/02/2018 05/23/2017  WBC 4.0 - 10.5 K/uL 6.0 8.5 6.6   Hemoglobin 12.0 - 15.0 g/dL 12.6 13.7 14.1  Hematocrit 36.0 - 46.0 % 39.6 42.6 42.2  Platelets 150 - 400 K/uL 214 267 314.0     CMP Latest Ref Rng & Units 10/24/2018 05/02/2018 11/20/2017  Glucose 70 - 99 mg/dL 89 91 93  BUN 8 - 23 mg/dL 27(H) 17 15  Creatinine 0.44 - 1.00 mg/dL 1.33(H) 0.85 0.83  Sodium 135 - 145 mmol/L 138 139 138  Potassium 3.5 - 5.1 mmol/L 4.0 4.2 4.9  Chloride 98 - 111 mmol/L 104 101 102  CO2 22 - 32 mmol/L 25 29 32  Calcium 8.9 - 10.3 mg/dL 8.3(L) 9.3 9.9  Total Protein 6.5 - 8.1 g/dL 7.0 7.4 -  Total Bilirubin 0.3 - 1.2 mg/dL 0.5 0.6 -  Alkaline Phos 38 - 126 U/L 95 103 -  AST 15 - 41 U/L 18 18 -  ALT 0 - 44 U/L 11 13 -      RADIOGRAPHIC STUDIES: I have personally reviewed the radiological images as listed and agreed with the findings in the report. No results found.   ASSESSMENT & PLAN:  Kristina Bridges is a 78 y.o. female with   1.Malignant neoplasm of upper-outer quadrant of right breast, StageIB, pT2N1aM0, ER+/PR/HER2-, [place] -She was diagnosed in 04/2018. She is s/p right breast lumpectomy with SLNB and adjuvant radiation. She tolerated very well.  -Pt declined mammaprint test for risk stratification, she is not interested in chemo  -We again reviewed her risk of breast cancer recurrence, especially metastatic recurrence, due to positive LN and h/o of breast cancer.  I strongly recommend her to consider adjuvant anti-estrogen therapy such as anastrozole.  --The potential benefit and side effects, which includes but not limited to, hot flash, skin and vaginal dryness, metabolic changes ( increased blood glucose, cholesterol, weight, etc.), slightly in increased risk of cardiovascular disease, cataracts, muscular and joint discomfort, osteopenia and osteoporosis, etc, were discussed with her in great details. She is very concerned about the side effects, and was reluctant to take. After a lengthy discussion, she agrees to try it and will  stop it if not tolerable. She will start as soon as she gets it.  -She is clinically doing well. Lab reviewed, her CBC and CMP are within normal limits except BUN 27, Cr 1.33, Ca 8.3. Her physical exam and was unremarkable. There is no clinical concern for recurrence. -We also discussed the breast cancer surveillance. She will continue annual screening mammogram, self exam, and a routine office visit with lab and exam with [place]. -Pt initially declined more follow up in the future. I reviewed the importance of cancer surveillance and monitoring when she is on AI, she finally agreed to f/u in 6 months then yearly after next appointment. She will see her PCP in interim.    2.H/oLeftbreast cancer -She was diagnosedin 1992. Tre[place]ast lumpect[place]and [place].  -Given this is her second breast cancer and strong family history of cancer she is eligible for genetic cancer, but she declined.  3. HTN -Continue Amlodipine and follow-up with PCP -BP at 168/83 (10/24/18). She will follow up with PCP to see if she should increase her medication dose. I encouraged her to monitor her BP at home.   4. Bone Health  -She has not had a bone density scan before as she declined in the past.  -I previously discussed Anastrozole can weaken her bone, so I recommend she get a baseline DEXA to monitor. She would like to wait for now.   5. Elevated Cr  -Cr at 1.33 today (10/24/18) -I strongly encouraged her to drink plenty of water, especially in the summer months. -will monitor.    PLAN: -I called in Anastrozole for her to start, she will try but will stop if she experience any side effects, I encourage her to call me if she has any concerns  -Lab and f/u in 6 months. She will see her PCP in interim.    No problem-specific Assessment & Plan notes found for this encounter.   No orders of the defined types were placed in this encounter.  All questions were answered. The patient  knows to call the clinic with any problems, questions or concerns. No barriers to learning was detected. I spent 25 minutes counseling the patient face to face. The total time spent in the appointment was 30 minutes and more than 50% was on counseling and review of test results     Truitt Merle, MD 10/24/2018   I, Joslyn Devon, am acting as scribe for Truitt Merle, MD.   I have reviewed the above documentation for accuracy and completeness, and I agree with the above.

## 2018-10-24 ENCOUNTER — Inpatient Hospital Stay (HOSPITAL_BASED_OUTPATIENT_CLINIC_OR_DEPARTMENT_OTHER): Payer: Medicare Other | Admitting: Hematology

## 2018-10-24 ENCOUNTER — Encounter: Payer: Self-pay | Admitting: Hematology

## 2018-10-24 ENCOUNTER — Other Ambulatory Visit: Payer: Self-pay

## 2018-10-24 ENCOUNTER — Inpatient Hospital Stay: Payer: Medicare Other | Attending: Hematology

## 2018-10-24 VITALS — BP 168/83 | HR 74 | Temp 98.9°F | Resp 20 | Ht 65.0 in | Wt 129.9 lb

## 2018-10-24 DIAGNOSIS — R7989 Other specified abnormal findings of blood chemistry: Secondary | ICD-10-CM | POA: Diagnosis not present

## 2018-10-24 DIAGNOSIS — Z170 Estrogen receptor positive status [ER+]: Secondary | ICD-10-CM | POA: Diagnosis not present

## 2018-10-24 DIAGNOSIS — C50411 Malignant neoplasm of upper-outer quadrant of right female breast: Secondary | ICD-10-CM | POA: Diagnosis not present

## 2018-10-24 DIAGNOSIS — I10 Essential (primary) hypertension: Secondary | ICD-10-CM | POA: Diagnosis not present

## 2018-10-24 DIAGNOSIS — Z923 Personal history of irradiation: Secondary | ICD-10-CM

## 2018-10-24 DIAGNOSIS — Z853 Personal history of malignant neoplasm of breast: Secondary | ICD-10-CM

## 2018-10-24 LAB — CBC WITH DIFFERENTIAL (CANCER CENTER ONLY)
Abs Immature Granulocytes: 0.01 10*3/uL (ref 0.00–0.07)
Basophils Absolute: 0.1 10*3/uL (ref 0.0–0.1)
Basophils Relative: 1 %
Eosinophils Absolute: 0.1 10*3/uL (ref 0.0–0.5)
Eosinophils Relative: 1 %
HCT: 39.6 % (ref 36.0–46.0)
Hemoglobin: 12.6 g/dL (ref 12.0–15.0)
Immature Granulocytes: 0 %
Lymphocytes Relative: 31 %
Lymphs Abs: 1.9 10*3/uL (ref 0.7–4.0)
MCH: 30.1 pg (ref 26.0–34.0)
MCHC: 31.8 g/dL (ref 30.0–36.0)
MCV: 94.7 fL (ref 80.0–100.0)
Monocytes Absolute: 0.7 10*3/uL (ref 0.1–1.0)
Monocytes Relative: 12 %
Neutro Abs: 3.3 10*3/uL (ref 1.7–7.7)
Neutrophils Relative %: 55 %
Platelet Count: 214 10*3/uL (ref 150–400)
RBC: 4.18 MIL/uL (ref 3.87–5.11)
RDW: 13.1 % (ref 11.5–15.5)
WBC Count: 6 10*3/uL (ref 4.0–10.5)
nRBC: 0 % (ref 0.0–0.2)

## 2018-10-24 LAB — CMP (CANCER CENTER ONLY)
ALT: 11 U/L (ref 0–44)
AST: 18 U/L (ref 15–41)
Albumin: 3.9 g/dL (ref 3.5–5.0)
Alkaline Phosphatase: 95 U/L (ref 38–126)
Anion gap: 9 (ref 5–15)
BUN: 27 mg/dL — ABNORMAL HIGH (ref 8–23)
CO2: 25 mmol/L (ref 22–32)
Calcium: 8.3 mg/dL — ABNORMAL LOW (ref 8.9–10.3)
Chloride: 104 mmol/L (ref 98–111)
Creatinine: 1.33 mg/dL — ABNORMAL HIGH (ref 0.44–1.00)
GFR, Est AFR Am: 45 mL/min — ABNORMAL LOW (ref >60)
GFR, Estimated: 38 mL/min — ABNORMAL LOW (ref >60)
Glucose, Bld: 89 mg/dL (ref 70–99)
Potassium: 4 mmol/L (ref 3.5–5.1)
Sodium: 138 mmol/L (ref 135–145)
Total Bilirubin: 0.5 mg/dL (ref 0.3–1.2)
Total Protein: 7 g/dL (ref 6.5–8.1)

## 2018-10-24 MED ORDER — ANASTROZOLE 1 MG PO TABS: 1.0000 mg | ORAL_TABLET | Freq: Every day | ORAL | 5 refills | Status: DC

## 2018-10-25 ENCOUNTER — Telehealth: Payer: Self-pay | Admitting: *Deleted

## 2018-10-25 ENCOUNTER — Telehealth: Payer: Self-pay | Admitting: Hematology

## 2018-10-25 NOTE — Progress Notes (Signed)
Please set up video or phone visit to address her blood pressure. Thank you.

## 2018-10-25 NOTE — Telephone Encounter (Signed)
I called the Kristina Bridges and informed her of the message below.  Patient stated she has an appt with Dr Ethlyn Gallery on 7/20 and will await this visit.  Message sent to Dr Maudie Mercury.

## 2018-10-25 NOTE — Telephone Encounter (Signed)
Scheduled appt per 7/16 los.  Printed and mailed appt calendar.

## 2018-10-25 NOTE — Telephone Encounter (Signed)
-----   Message from Lucretia Kern, DO sent at 10/25/2018 10:44 AM EDT -----   ----- Message ----- From: Truitt Merle, MD Sent: 10/24/2018   9:08 PM EDT To: Lucretia Kern, DO

## 2018-10-29 ENCOUNTER — Other Ambulatory Visit: Payer: Self-pay

## 2018-10-29 ENCOUNTER — Encounter: Payer: Self-pay | Admitting: Family Medicine

## 2018-10-29 ENCOUNTER — Telehealth: Payer: Self-pay | Admitting: *Deleted

## 2018-10-29 ENCOUNTER — Ambulatory Visit (INDEPENDENT_AMBULATORY_CARE_PROVIDER_SITE_OTHER): Payer: Medicare Other | Admitting: Family Medicine

## 2018-10-29 DIAGNOSIS — Z170 Estrogen receptor positive status [ER+]: Secondary | ICD-10-CM

## 2018-10-29 DIAGNOSIS — C50411 Malignant neoplasm of upper-outer quadrant of right female breast: Secondary | ICD-10-CM | POA: Diagnosis not present

## 2018-10-29 DIAGNOSIS — I10 Essential (primary) hypertension: Secondary | ICD-10-CM | POA: Diagnosis not present

## 2018-10-29 DIAGNOSIS — E785 Hyperlipidemia, unspecified: Secondary | ICD-10-CM | POA: Diagnosis not present

## 2018-10-29 DIAGNOSIS — R7989 Other specified abnormal findings of blood chemistry: Secondary | ICD-10-CM

## 2018-10-29 NOTE — Telephone Encounter (Signed)
-----   Message from Caren Macadam, MD sent at 10/29/2018 12:27 PM EDT ----- Please schedule lab visit to be done in next 2 weeks. Drink plenty of water before visit. Schedule in office bp follow up and bring home cuff and readings in 2-3 weeks. I would like bloodwork ahead of time if possible so we can review results together.

## 2018-10-29 NOTE — Progress Notes (Signed)
Virtual Visit via Video Note  I connected with Kristina Bridges on 10/29/18 at 12:00 PM EDT by a video enabled telemedicine application and verified that I am speaking with the correct person using two identifiers.  Location patient: home Location provider:work office Persons participating in the virtual visit: patient, provider  I discussed the limitations of evaluation and management by telemedicine and the availability of in person appointments. The patient expressed understanding and agreed to proceed.  Unable to do video connect; visit was completed by telephone.  Lyzette Manley Siers DOB: 1940-08-17 Encounter date: 10/29/2018  This is a 78 y.o. female who presents to establish care. Chief Complaint  Patient presents with  . Establish Care   Last visit with [facility] was 05/2018 for concerns of elevated blood pressure.  History of present illness:  HTN: States that she feels well. Does 20 laps every day, doing exercises daily. Norvasc 5mg . Has 3 children with cancer and then herself with breast cancer so isn't sure if stress of this is contributing to her elevated blood pressures. Feels really great overall. No fried foods. Low fat diet. Feels she eats very healthy. Does buy 50% reduced salt vegetables. Not getting light headed or dizzy. No swelling in ankles.   HL: diet controlled.  Breast Ca: following with Dr. Kelvin Cellar - radiation therapy. States she feels great. Energy level is really good. Home monitor is broken. Every time she is checked the pressure it is high. Anastrozole.  Allergies: not bothering her right.   Her O2 levels 100%.   Past Medical History:  Diagnosis Date  . Breast cancer ([place])    remote, tx with surgery and radiation - no recurrence  . Chicken pox   . Family history of cervical cancer   . Family history of leukemia   . Family history of prostate cancer   . Family history of throat cancer   . Hypertension   . Personal history of  radiation therapy   . Seasonal allergies    Past Surgical History:  Procedure Laterality Date  . BREAST BIOPSY  1992  . BREAST LUMPECTOMY Left   . BREAST LUMPECTOMY WITH RADIOACTIVE SEED AND SENTINEL LYMPH NODE BIOPSY Right 05/15/2018   Procedure: RIGHT BREAST LUMPECTOMY WITH RADIOACTIVE SEED AND RIGHT RADIOACTIVE SEED TARGETED LYMPH NODE BIOPSY AND RIGHT SENTINEL LYMPH NODE BIOPSY;  Surgeon: Stark Klein, MD;  Location: [place];  Service: General;  Laterality: Right;   No Known Allergies Current Meds  Medication Sig  . acetaminophen (TYLENOL) 500 MG tablet Take 500 mg by mouth as needed.  Marland Kitchen amLODipine (NORVASC) 5 MG tablet Take 1.5 tablets (7.5 mg total) by mouth daily.  Marland Kitchen anastrozole (ARIMIDEX) 1 MG tablet Take 1 tablet (1 mg total) by mouth daily.  . Multiple Vitamin (MULTIVITAMIN) capsule Take 1 capsule by mouth daily.   Social History   Tobacco Use  . Smoking status: Never Smoker  . Smokeless tobacco: Never Used  Substance Use Topics  . Alcohol use: No   Family History  Problem Relation Age of Onset  . Hypertension Other   . Hypertension Mother   . Cervical cancer Sister   . Cancer Brother        throat cancer   . Prostate cancer Brother   . Leukemia Daughter        CML  . Cervical cancer Daughter   . Prostate cancer Son   . Cervical cancer Sister      Review of Systems  Constitutional: Negative  for chills, fatigue and fever.  Respiratory: Negative for cough, chest tightness, shortness of breath and wheezing.   Cardiovascular: Negative for chest pain, palpitations and leg swelling.    Objective:  There were no vitals taken for this visit.      BP Readings from Last 3 Encounters:  10/24/18 (!) 168/83  09/20/18 (!) 155/64  06/13/18 (!) 199/90   Wt Readings from Last 3 Encounters:  10/24/18 129 lb 14.4 oz (58.9 kg)  09/20/18 126 lb 6 oz (57.3 kg)  06/13/18 126 lb 3.2 oz (57.2 kg)    EXAM:  GENERAL: alert, oriented, sounds well and in  no acute distress  LUNGS: no resp distress noted during conversation.   PSYCH/NEURO: pleasant and cooperative, no obvious depression or anxiety, speech and thought processing grossly intact   Assessment/Plan  1. Essential hypertension She has ordered a blood pressure cuff to check blood pressures at home which I think will be helpful since we only have office blood pressures to gauge medication adjustments on.  I have asked her to increase her amlodipine dose to 5 mg in the morning and 5 mg at bedtime.  I have encouraged her to check these blood pressures at home, and we will plan for a follow-up in the office in a few weeks to recheck.  We will also plan to recheck a BMP prior to that appointment since serum creatinine was slightly elevated with last blood work.  2. Mild hyperlipidemia Has been diet controlled.  3. Malignant neoplasm of upper-outer quadrant of right breast in female, estrogen receptor positive [facility]) Following with oncology.  Patient is feeling well overall.  4. Elevated serum creatinine See above. - Basic metabolic panel; Future    I discussed the assessment and treatment plan with the patient. The patient was provided an opportunity to ask questions and all were answered. The patient agreed with the plan and demonstrated an understanding of the instructions.   The patient was advised to call back or seek an in-person evaluation if the symptoms worsen or if the condition fails to improve as anticipated.  I provided 22 minutes of non-face-to-face time during this encounter.   Micheline Rough, MD

## 2018-10-29 NOTE — Telephone Encounter (Signed)
I called the pt, informed her of the message and scheduled the appts as below.

## 2018-11-08 ENCOUNTER — Telehealth: Payer: Self-pay

## 2018-11-08 ENCOUNTER — Encounter: Payer: Self-pay | Admitting: *Deleted

## 2018-11-08 NOTE — Telephone Encounter (Signed)
I am glad to hear that. Thanks   Truitt Merle MD

## 2018-11-08 NOTE — Telephone Encounter (Signed)
Patient calls to let Dr. Burr Medico know taking the Anastrozole and not having any side effects.

## 2018-11-09 ENCOUNTER — Telehealth: Payer: Self-pay | Admitting: Nurse Practitioner

## 2018-11-09 NOTE — Telephone Encounter (Signed)
Scheduled appt per 7/30 sch message - called pt . No answer/no vmail. Mailed letter with appt date and time

## 2018-11-12 ENCOUNTER — Other Ambulatory Visit (INDEPENDENT_AMBULATORY_CARE_PROVIDER_SITE_OTHER): Payer: Medicare Other

## 2018-11-12 ENCOUNTER — Other Ambulatory Visit: Payer: Self-pay

## 2018-11-12 DIAGNOSIS — R7989 Other specified abnormal findings of blood chemistry: Secondary | ICD-10-CM

## 2018-11-12 LAB — BASIC METABOLIC PANEL
BUN: 14 mg/dL (ref 6–23)
CO2: 26 mEq/L (ref 19–32)
Calcium: 9.9 mg/dL (ref 8.4–10.5)
Chloride: 101 mEq/L (ref 96–112)
Creatinine, Ser: 0.68 mg/dL (ref 0.40–1.20)
GFR: 101.37 mL/min (ref >60.00)
Glucose, Bld: 78 mg/dL (ref 70–99)
Potassium: 4 mEq/L (ref 3.5–5.1)
Sodium: 137 mEq/L (ref 135–145)

## 2018-11-13 ENCOUNTER — Ambulatory Visit: Payer: Self-pay

## 2018-11-13 NOTE — Telephone Encounter (Signed)
Provided lab results per Dr. Ethlyn Gallery 11/12/2018. Patient voices understanding.  Patient states her Cancer Dr.  Prescribed anastrozole. Patient states that she can't tolerate that particular medication. causes her to itch have hives Will note take it any more .

## 2018-11-15 ENCOUNTER — Telehealth: Payer: Self-pay

## 2018-11-15 ENCOUNTER — Other Ambulatory Visit: Payer: Self-pay | Admitting: Family Medicine

## 2018-11-15 ENCOUNTER — Other Ambulatory Visit: Payer: Self-pay

## 2018-11-15 ENCOUNTER — Other Ambulatory Visit: Payer: Self-pay | Admitting: Hematology

## 2018-11-15 DIAGNOSIS — Z170 Estrogen receptor positive status [ER+]: Secondary | ICD-10-CM

## 2018-11-15 DIAGNOSIS — C50411 Malignant neoplasm of upper-outer quadrant of right female breast: Secondary | ICD-10-CM

## 2018-11-15 MED ORDER — EXEMESTANE 25 MG PO TABS: 25.0000 mg | ORAL_TABLET | Freq: Every day | ORAL | 2 refills | Status: DC

## 2018-11-15 MED ORDER — LETROZOLE 2.5 MG PO TABS: 2.5000 mg | ORAL_TABLET | Freq: Every day | ORAL | 2 refills | Status: DC

## 2018-11-15 NOTE — Telephone Encounter (Signed)
She needs to discuss this side effect with her cancer specialist. I have taken med off her list, but make sure she talks with them!

## 2018-11-15 NOTE — Telephone Encounter (Signed)
Patient calls stating she was unable to take the Anastrozole, she developed a rash and hives with itching.

## 2018-11-15 NOTE — Telephone Encounter (Signed)
Attempted to contact patient to let her know I received her message regarding exemestane being too expensive.It rang about 10 times with no answer and no voice mail option.    We have sent in Letrozole 2.5 mg to her pharmacy shouldn't be expensive.  I will try again later.

## 2018-11-15 NOTE — Telephone Encounter (Signed)
I called the pt and informed her of the message below and she agreed to call the specialist.

## 2018-11-15 NOTE — Telephone Encounter (Signed)
I have called her back, and recommend her to switch to exemestane. She is agreeable, I called in to her pharmacy and she will start in 2 weeks.   Truitt Merle MD

## 2018-11-28 ENCOUNTER — Encounter: Payer: Self-pay | Admitting: Family Medicine

## 2018-11-28 ENCOUNTER — Ambulatory Visit (INDEPENDENT_AMBULATORY_CARE_PROVIDER_SITE_OTHER): Payer: Medicare Other | Admitting: Family Medicine

## 2018-11-28 ENCOUNTER — Other Ambulatory Visit: Payer: Self-pay

## 2018-11-28 VITALS — BP 120/62 | HR 84 | Temp 98.7°F | Wt 125.4 lb

## 2018-11-28 DIAGNOSIS — I10 Essential (primary) hypertension: Secondary | ICD-10-CM | POA: Diagnosis not present

## 2018-11-28 MED ORDER — AMLODIPINE BESYLATE 5 MG PO TABS: 5.0000 mg | ORAL_TABLET | Freq: Two times a day (BID) | ORAL | 1 refills | Status: DC

## 2018-11-28 NOTE — Progress Notes (Signed)
  Kristina Bridges DOB: [birth year] Encounter date: 11/28/2018  This is a 78 y.o. female who presents with Chief Complaint  Patient presents with  . Follow-up    Pt states she has no concerns and feeling good     History of present illness: Took about a week for changes with bp but doing well with the amlodpine BID - pressures much better. Not had any lower than in office reading today. Highest in last few days was just out of "good range" on meter.   Very active every day - inside and outside. Stays up with this and does it early to avoid the very hot weather.    Allergies  Allergen Reactions  . Femara [Letrozole] Rash   Current Meds  Medication Sig  . acetaminophen (TYLENOL) 500 MG tablet Take 500 mg by mouth as needed.  Marland Kitchen amLODipine (NORVASC) 5 MG tablet Take 1.5 tablets (7.5 mg total) by mouth daily.  . Multiple Vitamin (MULTIVITAMIN) capsule Take 1 capsule by mouth daily.    Review of Systems  Constitutional: Negative for chills, fatigue and fever.  Respiratory: Negative for cough, chest tightness, shortness of breath and wheezing.   Cardiovascular: Negative for chest pain, palpitations and leg swelling.    Objective:  BP 120/62 (BP Location: Right Arm, Patient Position: Sitting, Cuff Size: Normal)   Pulse 84   Temp 98.7 F (37.1 C) (Temporal)   Wt 125 lb 6.4 oz (56.9 kg)   SpO2 96%   BMI 20.87 kg/m   Weight: 125 lb 6.4 oz (56.9 kg)   BP Readings from Last 3 Encounters:  11/28/18 120/62  10/24/18 (!) 168/83  09/20/18 (!) 155/64   Wt Readings from Last 3 Encounters:  11/28/18 125 lb 6.4 oz (56.9 kg)  10/24/18 129 lb 14.4 oz (58.9 kg)  09/20/18 126 lb 6 oz (57.3 kg)    Physical Exam Constitutional:      General: She is not in acute distress.    Appearance: She is well-developed.  Cardiovascular:     Rate and Rhythm: Normal rate and regular rhythm.     Heart sounds: Normal heart sounds. No murmur. No friction rub.  Pulmonary:     Effort:  Pulmonary effort is normal. No respiratory distress.     Breath sounds: Normal breath sounds. No wheezing or rales.  Musculoskeletal:     Right lower leg: No edema.     Left lower leg: No edema.  Neurological:     Mental Status: She is alert and oriented to person, place, and time.  Psychiatric:        Behavior: Behavior normal.     Assessment/Plan  1. Essential hypertension Blood pressures are much improved on new regimen. I have asked her to continue to monitor at home and especially watch for low pressures or symptomatic lower pressures.    Return in about 6 months (around 05/31/2019).    Micheline Rough, MD

## 2018-12-27 ENCOUNTER — Other Ambulatory Visit: Payer: Self-pay | Admitting: Family Medicine

## 2019-01-22 ENCOUNTER — Telehealth: Payer: Self-pay | Admitting: Nurse Practitioner

## 2019-01-22 NOTE — Telephone Encounter (Signed)
Called patient to confirm her SCP appointment. She was unsure of the reason for the visit but agrees to come as scheduled.

## 2019-01-23 ENCOUNTER — Telehealth: Payer: Self-pay | Admitting: Nurse Practitioner

## 2019-01-23 ENCOUNTER — Inpatient Hospital Stay: Payer: Medicare Other | Admitting: Nurse Practitioner

## 2019-01-23 NOTE — Telephone Encounter (Signed)
Called pt - no answer - left message for patient to call back and reschedule appt .

## 2019-02-04 DIAGNOSIS — C50911 Malignant neoplasm of unspecified site of right female breast: Secondary | ICD-10-CM | POA: Diagnosis not present

## 2019-02-04 DIAGNOSIS — C773 Secondary and unspecified malignant neoplasm of axilla and upper limb lymph nodes: Secondary | ICD-10-CM | POA: Diagnosis not present

## 2019-02-04 DIAGNOSIS — Z170 Estrogen receptor positive status [ER+]: Secondary | ICD-10-CM | POA: Diagnosis not present

## 2019-02-04 DIAGNOSIS — C50111 Malignant neoplasm of central portion of right female breast: Secondary | ICD-10-CM | POA: Diagnosis not present

## 2019-04-26 ENCOUNTER — Inpatient Hospital Stay: Payer: Medicare Other | Attending: Hematology

## 2019-04-26 ENCOUNTER — Inpatient Hospital Stay: Payer: Medicare Other | Admitting: Hematology

## 2019-04-26 ENCOUNTER — Telehealth: Payer: Self-pay | Admitting: Hematology

## 2019-04-26 DIAGNOSIS — Z806 Family history of leukemia: Secondary | ICD-10-CM | POA: Insufficient documentation

## 2019-04-26 DIAGNOSIS — I10 Essential (primary) hypertension: Secondary | ICD-10-CM | POA: Insufficient documentation

## 2019-04-26 DIAGNOSIS — Z8049 Family history of malignant neoplasm of other genital organs: Secondary | ICD-10-CM | POA: Insufficient documentation

## 2019-04-26 DIAGNOSIS — Z808 Family history of malignant neoplasm of other organs or systems: Secondary | ICD-10-CM | POA: Insufficient documentation

## 2019-04-26 DIAGNOSIS — Z853 Personal history of malignant neoplasm of breast: Secondary | ICD-10-CM | POA: Insufficient documentation

## 2019-04-26 DIAGNOSIS — Z923 Personal history of irradiation: Secondary | ICD-10-CM | POA: Insufficient documentation

## 2019-04-26 NOTE — Telephone Encounter (Signed)
Returned patient's phone call regarding rescheduling 01/15 appointment, left a voicemail.

## 2019-05-01 ENCOUNTER — Telehealth: Payer: Self-pay | Admitting: Hematology

## 2019-05-01 NOTE — Telephone Encounter (Signed)
Returned patient's phone call regarding rescheduling 01/15 appointment, per patient's request appointment has moved to 01/22.

## 2019-05-02 NOTE — Progress Notes (Signed)
Kristina Bridges   Telephone:([phone] [phone]) [phone]   Clinic Follow up Note   Patient Care Team: Caren Macadam, MD as PCP - General (Family Medicine) Stark Klein, MD as Consulting Physician (General Surgery) Truitt Merle, MD as Consulting Physician (Hematology) Gery Pray, MD as Consulting Physician (Radiation Oncology) Alla Feeling, NP as Nurse Practitioner (Nurse Practitioner)  Date of Service:  05/03/2019  CHIEF COMPLAINT: F/u of right breast cancer  SUMMARY OF ONCOLOGIC HISTORY: Oncology History Overview Note  Cancer Staging Malignant neoplasm of upper-outer quadrant of right breast in female, estrogen receptor positive ([place]) Staging form: Breast, AJCC 8th Edition - Clinical stage from 04/20/2018: Stage IIA (cT2, cN1, cM0, G1, ER+, PR+, HER2-) - Unsigned - Pathologic stage from 05/15/2018: Stage IB (pT2, pN1a, cM0, G2, ER+, PR+, HER2-) - Signed by Truitt Merle, MD on 05/25/2018     Malignant neoplasm of upper-outer quadrant of right breast in female, estrogen receptor positive ([place])  04/18/2018 Mammogram   Diagnostic [place] 04/18/18  IMPRESSION: Suspicious mass measuring 2.4 x 1.3 centimeters in the retroareolar region of the RIGHT breast  Enlarged RIGHT axillary lymph node.   04/20/2018 Initial Biopsy   Diagnosis 04/20/18 1. Breast, right, needle core biopsy, 11 o'clock - INVASIVE DUCTAL CARCINOMA. - SEE COMMENT. 2. Lymph node, needle/core biopsy, right axilla - METASTATIC CARCINOMA IN 1 OF 1 LYMPH NODE (1/1). - SEE COMMENT. Microscopic Comment 1. The carcinoma appears grade I. A breast prognostic profile will be performed and the results reported separately. 2. The carcinoma in part 2 is somewhat morphologically dissimilar from that seen in part 1. A breast prognostic profile will also be performed on part 2 and the results reported separately. The results were called to [facility] on 04/23/2018. (JBK:ecj 04/23/2018)     04/20/2018 Receptors her2   IMMUNOHISTOCHEMICAL AND MORPHOMETRIC ANALYSIS PERFORMED MANUALLY The tumor cells are NEGATIVE for Her2 (1+). Estrogen Receptor: 100%, POSITIVE, STRONG STAINING INTENSITY Progesterone Receptor: 50%, POSITIVE, STRONG STAINING INTENSITY Proliferation Marker Ki67: 40%   04/27/2018 Initial Diagnosis   Malignant neoplasm of upper-outer quadrant of right breast in female, estrogen receptor positive ([place])   05/15/2018 Surgery   RIGHT BREAST LUMPECTOMY WITH RADIOACTIVE SEED AND RIGHT RADIOACTIVE SEED TARGETED LYMPH NODE BIOPSY AND RIGHT SENTINEL LYMPH NODE BIOPSY by Dr. Barry Dienes  05/15/18    05/15/2018 Pathology Results   Diagnosis 1. Breast, lumpectomy, Right w/seed - INVASIVE DUCTAL CARCINOMA, GRADE II, 3.7 CM. - DUCTAL CARCINOMA IN SITU, INTERMEDIATE NUCLEAR GRADE. - LOBULAR CARCINOMA IN SITU. - CARCINOMA IS 1 MM FROM THE POSTERIOR MARGIN AND 4 MM FROM THE SUPERIOR MARGIN. SEE NOTE - CARCINOMA FOCALLY INVOLVES SUBAREOLAR DERMIS BUT NIPPLE IS NOT INVOLVED. - DUCTAL CARCINOMA IN SITU IS 0.5 CM FROM THE MEDIAL MARGIN - NEGATIVE FOR LYMPHOVASCULAR OR PERINEURAL INVASION. - BIOPSY SITE CHANGES. - SEE ONCOLOGY TABLE 2. Lymph node, sentinel, biopsy, Right axillary 1&2 with seed - METASTATIC DUCTAL CARCINOMA TO A LYMPH NODE (1/1). - METASTATIC FOCUS MEASURES 2.3 CM AND SHOWS EXTRANODAL EXTENSION. 3. Lymph node, sentinel, biopsy, Right - LYMPH NODE, NEGATIVE FOR CARCINOMA (0/1). - IMMUNOSTAIN FOR PANKERATIN (AE1/AE3) IS NEGATIVE 4. Lymph node, sentinel, biopsy, Right axillary #3 - LYMPH NODE, NEGATIVE FOR CARCINOMA (0/1). - IMMUNOSTAIN FOR PANKERATIN (AE1/AE3) IS NEGATIVE    05/15/2018 Cancer Staging   Staging form: Breast, AJCC 8th Edition - Pathologic stage from 05/15/2018: Stage IB (pT2, pN1a, cM0, G2, ER+, PR+, HER2-) - Signed by Truitt Merle, MD on 05/25/2018   07/03/2018 -  08/20/2018 Radiation Therapy   adjuvant radiation 07/03/18-08/20/18 by Dr. Sondra Come   11/08/2018 -   Anti-estrogen oral therapy   Adjuvant Anastrozole 69m daily starting 10/24/18. Due to reaction with skin rash, This was stopped 1 week later. She declined exemestane due to high copay.  She was switched to Letrozole in 11/2018 but she never took it. She started in 04/2019.       CURRENT THERAPY:  AdjuvantAnastrozole 172mdaily starting 10/24/18. Due to reaction with skin rash, This was stopped 1 week later. She was switched to Letrozole in 11/2018 but she never took it. She started in 04/2019.    INTERVAL HISTORY:  WiPatton Bridges here for a follow up. She presents to the clinic alone. She was last seen by me un 6 months. She notes she is doing well. She notes she did not pick up Letrozole as she is apprehensive about continuing treatment. She is willing to try it. She also notes she told her insurance company she would see Med Onc and Surgeon once a year. She notes she has changed PCP.     REVIEW OF SYSTEMS:   Constitutional: Denies fevers, chills or abnormal weight loss Eyes: Denies blurriness of vision Ears, nose, mouth, throat, and face: Denies mucositis or sore throat Respiratory: Denies cough, dyspnea or wheezes Cardiovascular: Denies palpitation, chest discomfort or lower extremity swelling Gastrointestinal:  Denies nausea, heartburn or change in bowel habits Skin: Denies abnormal skin rashes Lymphatics: Denies new lymphadenopathy or easy bruising Neurological:Denies numbness, tingling or new weaknesses Behavioral/Psych: Mood is stable, no new changes  All other systems were reviewed with the patient and are negative.  MEDICAL HISTORY:  Past Medical History:  Diagnosis Date  . [place]   [place], tx with surgery and radiation - no recurrence  . Chicken pox   . Family history of cervical cancer   . Family history of leukemia   . Family history of prostate cancer   . Family history of throat cancer   . Hypertension   . Personal history of radiation  therapy   . Seasonal allergies     SURGICAL HISTORY: Past Surgical History:  Procedure Laterality Date  . BREAST BIOPSY  1992  . BREAST LUMPECTOMY Left   . BREAST LUMPECTOMY WITH RADIOACTIVE SEED AND SENTINEL LYMPH NODE BIOPSY Right 05/15/2018   Procedure: RIGHT BREAST LUMPECTOMY WITH RADIOACTIVE SEED AND RIGHT RADIOACTIVE SEED TARGETED LYMPH NODE BIOPSY AND RIGHT SENTINEL LYMPH NODE BIOPSY;  Surgeon: ByStark KleinMD;  [facility] [facility] Service: General;  Laterality: Right;    I have reviewed the social history and family history with the patient and they are unchanged from previous note.  ALLERGIES:  is allergic to anastrozole.  MEDICATIONS:  Current Outpatient Medications  Medication Sig Dispense Refill  . acetaminophen (TYLENOL) 500 MG tablet Take 500 mg by mouth as needed.    . Marland KitchenmLODipine (NORVASC) 5 MG tablet TAKE 1 AND 1/2 TABLETS(7.5 MG) BY MOUTH DAILY 135 tablet 1  . Multiple Vitamin (MULTIVITAMIN) capsule Take 1 capsule by mouth daily.    . Marland Kitchenetrozole (FEMARA) 2.5 MG tablet Take 1 tablet (2.5 mg total) by mouth daily. 30 tablet 3   No current facility-administered medications for this visit.    PHYSICAL EXAMINATION: ECOG PERFORMANCE STATUS: 0 - Asymptomatic  Vitals:   05/03/19 1045  BP: (!) 161/78  Pulse: 71  Resp: 17  Temp: 98 F (36.7 C)  SpO2: 98%   Filed Weights  05/03/19 1045  Weight: 124 lb 11.2 oz (56.6 kg)    GENERAL:alert, no distress and comfortable SKIN: skin color, texture, turgor are normal, no rashes or significant lesions EYES: normal, Conjunctiva are pink and non-injected, sclera clear  NECK: supple, thyroid normal size, non-tender, without nodularity LYMPH:  no palpable lymphadenopathy in the cervical, axillary  LUNGS: clear to auscultation and percussion with normal breathing effort HEART: regular rate & rhythm and no murmurs and no lower extremity edema ABDOMEN:abdomen soft, non-tender and normal bowel  sounds Musculoskeletal:no cyanosis of digits and no clubbing  NEURO: alert & oriented x 3 with fluent speech, no focal motor/sensory deficits BREAST: S/p b/l lumpectomies: Surgical incisions have healed well with scar tissue and B/l nipple inversion. No palpable mass, nodules or adenopathy bilaterally. Breast exam benign.   LABORATORY DATA:  I have reviewed the data as listed CBC Latest Ref Rng & Units 05/03/2019 10/24/2018 05/02/2018  WBC 4.0 - 10.5 K/uL 6.8 6.0 8.5  Hemoglobin 12.0 - 15.0 g/dL 14.0 12.6 13.7  Hematocrit 36.0 - 46.0 % 42.6 39.6 42.6  Platelets 150 - 400 K/uL 222 214 267     CMP Latest Ref Rng & Units 05/03/2019 11/12/2018 10/24/2018  Glucose 70 - 99 mg/dL 86 78 89  BUN 8 - 23 mg/dL 17 14 27(H)  Creatinine 0.44 - 1.00 mg/dL 0.82 0.68 1.33(H)  Sodium 135 - 145 mmol/L 140 137 138  Potassium 3.5 - 5.1 mmol/L 4.7 4.0 4.0  Chloride 98 - 111 mmol/L 103 101 104  CO2 22 - 32 mmol/L _0 Calcium 8.9 - 10.3 mg/dL 9.4 9.9 8.3(L)  Total Protein 6.5 - 8.1 g/dL 7.2 - 7.0  Total Bilirubin 0.3 - 1.2 mg/dL 0.8 - 0.5  Alkaline Phos 38 - 126 U/L 103 - 95  AST 15 - 41 U/L 17 - 18  ALT 0 - 44 U/L 9 - 11      RADIOGRAPHIC STUDIES: I have personally reviewed the radiological images as listed and agreed with the findings in the report. No results found.   ASSESSMENT & PLAN:  Kristina Bridges is a 79 y.o. female with    1.Malignant neoplasm of upper-outer quadrant of right breast, StageIB, pT2N1aM0, ER+/PR/HER2-, Kristina Bridges -She was diagnosed in 04/2018. She is s/prightbreast lumpectomy with SLNB and adjuvant radiation. She tolerated very well. Pt declined mammaprint test for risk stratification, she is not interested in chemo  -She started antiestrogen therapy with Anastrozole in 10/2018. Due to reaction with skin rash, This was stopped 1 week later. She was switched to Letrozole in 11/2018 however she did not fill the prescription  -I again discussed given her stage II  node positive disease her risk of recurrence is moderate to higher. She is rather healthy and will likely tolerate Letrozole well. She was reluctant to take it initially, after detailed discussion, she agreed to try it. Plan to start this month (04/2019).  She states she will stop it if she does experience side effects. -She is clinically doing well. Lab reviewed, her CBC and CMP are within normal limits. Her physical exam was unremarkable. There is no clinical concern for recurrence. -Continue surveillance. She is due for Mammogram, will order for this month. Will continue yearly  -I offered her survivorship clinic, she declined.  -Virtual visit in 4 months, she prefers to see [place] once a year, I encourage her to f/u with her surgeon in the interim    2.H/oLeftbreast cancer -She was diagnosedin 1992. Treated with Leftbreast  lumpectomyand radiation in [place].  -Given this is her second breast cancer and strong family history of cancer she is eligible for genetic cancer, but she declined.  3. HTN -ContinueAmlodipineand follow-up with PCP -Not well controlled. I encouraged her to monitor her BP at home and f/u with PCP for possible medication adjustment.   4. Bone Health  -She has not had a bone density scan before as she declined in the past.  -I previously discussed Anastrozole can weaken her bone, so I recommended she get a baseline DEXA to monitor. She would like to wait for now.   PLAN:  -Mammogram in 1-2 weeks  -I called in Letrozole to start this month  -Virtual visit in 4 months and OV with lab in one year    No problem-specific Assessment & Plan notes found for this encounter.   Orders Placed This Encounter  Procedures  . MM DIAG BREAST TOMO BILATERAL    Standing Status:   Future    Standing Expiration Date:   05/02/2020    Order Specific Question:   Reason for Exam (SYMPTOM  OR DIAGNOSIS REQUIRED)    Answer:   screening    Order Specific Question:   Preferred  imaging location?    Answer:   [facility]    Order Specific Question:   Release to patient    Answer:   Immediate   All questions were answered. The patient knows to call the clinic with any problems, questions or concerns. No barriers to learning was detected. The total time spent in the appointment was 30 minutes.     Truitt Merle, MD 05/03/2019   I, Joslyn Devon, am acting as scribe for Truitt Merle, MD.   I have reviewed the above documentation for accuracy and completeness, and I agree with the above.

## 2019-05-03 ENCOUNTER — Inpatient Hospital Stay (HOSPITAL_BASED_OUTPATIENT_CLINIC_OR_DEPARTMENT_OTHER): Payer: Medicare Other | Admitting: Hematology

## 2019-05-03 ENCOUNTER — Encounter: Payer: Self-pay | Admitting: Hematology

## 2019-05-03 ENCOUNTER — Inpatient Hospital Stay: Payer: Medicare Other

## 2019-05-03 ENCOUNTER — Other Ambulatory Visit: Payer: Self-pay

## 2019-05-03 VITALS — BP 161/78 | HR 71 | Temp 98.0°F | Resp 17 | Ht 65.0 in | Wt 124.7 lb

## 2019-05-03 DIAGNOSIS — Z170 Estrogen receptor positive status [ER+]: Secondary | ICD-10-CM

## 2019-05-03 DIAGNOSIS — C50411 Malignant neoplasm of upper-outer quadrant of right female breast: Secondary | ICD-10-CM | POA: Diagnosis not present

## 2019-05-03 DIAGNOSIS — I10 Essential (primary) hypertension: Secondary | ICD-10-CM | POA: Diagnosis not present

## 2019-05-03 DIAGNOSIS — Z853 Personal history of malignant neoplasm of breast: Secondary | ICD-10-CM | POA: Diagnosis not present

## 2019-05-03 DIAGNOSIS — Z923 Personal history of irradiation: Secondary | ICD-10-CM | POA: Diagnosis not present

## 2019-05-03 DIAGNOSIS — Z8049 Family history of malignant neoplasm of other genital organs: Secondary | ICD-10-CM | POA: Diagnosis not present

## 2019-05-03 DIAGNOSIS — Z806 Family history of leukemia: Secondary | ICD-10-CM | POA: Diagnosis not present

## 2019-05-03 DIAGNOSIS — Z808 Family history of malignant neoplasm of other organs or systems: Secondary | ICD-10-CM | POA: Diagnosis not present

## 2019-05-03 LAB — CMP (CANCER CENTER ONLY)
ALT: 9 U/L (ref 0–44)
AST: 17 U/L (ref 15–41)
Albumin: 4.3 g/dL (ref 3.5–5.0)
Alkaline Phosphatase: 103 U/L (ref 38–126)
Anion gap: 9 (ref 5–15)
BUN: 17 mg/dL (ref 8–23)
CO2: 28 mmol/L (ref 22–32)
Calcium: 9.4 mg/dL (ref 8.9–10.3)
Chloride: 103 mmol/L (ref 98–111)
Creatinine: 0.82 mg/dL (ref 0.44–1.00)
GFR, Est AFR Am: >60 mL/min (ref >60)
GFR, Estimated: >60 mL/min (ref >60)
Glucose, Bld: 86 mg/dL (ref 70–99)
Potassium: 4.7 mmol/L (ref 3.5–5.1)
Sodium: 140 mmol/L (ref 135–145)
Total Bilirubin: 0.8 mg/dL (ref 0.3–1.2)
Total Protein: 7.2 g/dL (ref 6.5–8.1)

## 2019-05-03 LAB — CBC WITH DIFFERENTIAL (CANCER CENTER ONLY)
Abs Immature Granulocytes: 0.01 10*3/uL (ref 0.00–0.07)
Basophils Absolute: 0 10*3/uL (ref 0.0–0.1)
Basophils Relative: 0 %
Eosinophils Absolute: 0 10*3/uL (ref 0.0–0.5)
Eosinophils Relative: 0 %
HCT: 42.6 % (ref 36.0–46.0)
Hemoglobin: 14 g/dL (ref 12.0–15.0)
Immature Granulocytes: 0 %
Lymphocytes Relative: 21 %
Lymphs Abs: 1.4 10*3/uL (ref 0.7–4.0)
MCH: 31.1 pg (ref 26.0–34.0)
MCHC: 32.9 g/dL (ref 30.0–36.0)
MCV: 94.7 fL (ref 80.0–100.0)
Monocytes Absolute: 0.6 10*3/uL (ref 0.1–1.0)
Monocytes Relative: 9 %
Neutro Abs: 4.7 10*3/uL (ref 1.7–7.7)
Neutrophils Relative %: 70 %
Platelet Count: 222 10*3/uL (ref 150–400)
RBC: 4.5 MIL/uL (ref 3.87–5.11)
RDW: 12.9 % (ref 11.5–15.5)
WBC Count: 6.8 10*3/uL (ref 4.0–10.5)
nRBC: 0 % (ref 0.0–0.2)

## 2019-05-03 MED ORDER — LETROZOLE 2.5 MG PO TABS: 2.5000 mg | ORAL_TABLET | Freq: Every day | ORAL | 3 refills | Status: DC

## 2019-05-06 ENCOUNTER — Telehealth: Payer: Self-pay | Admitting: Hematology

## 2019-05-06 NOTE — Telephone Encounter (Signed)
Scheduled appt per 1/22 los.  Was able to leave part of a vm of the appt date and time,.  VM cut off saying memory was full.

## 2019-05-28 ENCOUNTER — Other Ambulatory Visit: Payer: Self-pay

## 2019-05-29 ENCOUNTER — Ambulatory Visit: Payer: Medicare Other | Admitting: Family Medicine

## 2019-05-29 NOTE — Progress Notes (Deleted)
  Kristina Bridges DOB: 1940/11/29 Encounter date: 05/29/2019  This is a 79 y.o. female who presents with No chief complaint on file.   History of present illness: [id number] 7.5mg  daily  HPI   Allergies  Allergen Reactions  . Anastrozole     Skin rashes    No outpatient medications have been marked as taking for the 05/29/19 encounter (Appointment) with Caren Macadam, MD.    Review of Systems  Objective:  There were no vitals taken for this visit.      BP Readings from Last 3 Encounters:  05/03/19 (!) 161/78  11/28/18 120/62  10/24/18 (!) 168/83   Wt Readings from Last 3 Encounters:  05/03/19 124 lb 11.2 oz (56.6 kg)  11/28/18 125 lb 6.4 oz (56.9 kg)  10/24/18 129 lb 14.4 oz (58.9 kg)    Physical Exam  Assessment/Plan  There are no diagnoses linked to this encounter.       Micheline Rough, MD

## 2019-06-03 ENCOUNTER — Telehealth: Payer: Self-pay | Admitting: Family Medicine

## 2019-06-03 ENCOUNTER — Other Ambulatory Visit: Payer: Self-pay | Admitting: Family Medicine

## 2019-06-03 MED ORDER — HYDROCHLOROTHIAZIDE 12.5 MG PO TABS: 12.5000 mg | ORAL_TABLET | Freq: Every day | ORAL | 0 refills | Status: DC

## 2019-06-03 NOTE — Telephone Encounter (Signed)
If consistently high for last week or longer, I would add 12.5mg  hctz at this point. We cannot increase the amlodipine over 10mg  daily. She would take hctz in the morning. I would like follow up for her within 2 weeks time, sooner if not improving or any problems with medication. This is a less potent fluid pill, but will work nicely with amlodipine to help with blood pressure control. Looks like she has already rescheduled a visit.   I don't see she has taken other bp medication in the past; so I feel this is good add on. If pressures only high in last couple of days, then may want to monitor another 1-2 days and make sure staying elevated. If concerns sooner; schedule telemed with [facility].

## 2019-06-03 NOTE — Telephone Encounter (Signed)
The patient is having problems with her BP  160/80 regular reading.  She started taking Letrozole 2 mg.used to help prevent the cancer from returning.   Pt says her reading are  159/92  162/92 158/91.  Patient  contact number [phone]. pt  would like to know what should she do

## 2019-06-03 NOTE — Telephone Encounter (Signed)
Spoke with the pt and she stated she is taking Amlodipine 5mg  twice a day, not 7.5mg  once a day, denies any swelling in the lower extremities and she is feeling OK.  Message sent to PCP.

## 2019-06-03 NOTE — Telephone Encounter (Signed)
Spoke with the pt and she stated her BP has been elevated for 5 days.  Patient is aware the Rx for HCTZ was sent to the pharmacy and will call back if needed.

## 2019-06-03 NOTE — Telephone Encounter (Signed)
Please confirm that she is still taking the amlodipine 7.5mg  daily? And if so; any swelling in legs? How is she feeling? Any headaches or symptoms with elevated pressures?   (of note, had appointment 2/17 and cancelled)

## 2019-06-07 ENCOUNTER — Other Ambulatory Visit: Payer: Self-pay

## 2019-06-07 ENCOUNTER — Encounter (HOSPITAL_COMMUNITY): Payer: Self-pay

## 2019-06-07 ENCOUNTER — Ambulatory Visit (HOSPITAL_COMMUNITY)
Admission: EM | Admit: 2019-06-07 | Discharge: 2019-06-07 | Disposition: A | Discharge status: Home / Self Care | Payer: Medicare Other

## 2019-06-07 ENCOUNTER — Telehealth: Payer: Self-pay | Admitting: Hematology

## 2019-06-07 ENCOUNTER — Telehealth: Payer: Self-pay

## 2019-06-07 DIAGNOSIS — I10 Essential (primary) hypertension: Secondary | ICD-10-CM | POA: Diagnosis not present

## 2019-06-07 DIAGNOSIS — R030 Elevated blood-pressure reading, without diagnosis of hypertension: Secondary | ICD-10-CM

## 2019-06-07 NOTE — Telephone Encounter (Signed)
She should still be taking her amlodipine 10 mg daily total dose that she was before.  Please make sure she has not stopped this medication.  The hydrochlorothiazide was added to take on top of the amlodipine for better blood pressure control.  If this is how she has been doing it, she can increase hydrochlorothiazide to 2 tablets (25 mg) daily.  If they do stop her new medication, she can stop the hydrochlorothiazide and see how her blood pressure does with stopping this medication.  If she is continuing on the hydrochlorothiazide, we will need to check a metabolic panel in 1 to 2 weeks.  Follow-up visit for check of blood pressure in the office would also be helpful.

## 2019-06-07 NOTE — Discharge Instructions (Addendum)
For diabetes and high blood sugar, please make sure you are avoiding starchy, carbohydrate foods like pasta, breads, pastry, rice, potatoes, desserts. These foods can elevated your blood sugar. Also, limit your alcohol drinking to 1 per day, avoid sodas, sweet teas. For elevated blood pressure, make sure you are monitoring salt in your diet.  Do not eat restaurant foods and limit processed foods at home.  Processed foods include things like frozen meals, pre-seasoned meats and dinners, deli meats, prepackaged meals, canned foods.  Make sure your pain attention to salt (sodium) labels on foods you by at the grocery store.  For seasoning you can use a brand called Mrs. Dash which includes a lot of salt free seasonings.  Salads - kale, spinach, cabbage, spring mix; use seeds like pumpkin seeds or sunflower seeds, almonds; you can also use 1-2 hard boiled eggs in your salads Fruits - avocadoes, berries (blueberries, raspberries, blackberries), apples, oranges Vegetables - aspargus, cauliflower, broccoli, green beans, brussel spouts, bell peppers; stay away from starchy vegetables like potatoes, carrots, peas  Regarding meat it is better to eat lean meats and limit your red meat consumption including pork.  Wild caught fish, chicken breast are good options.  Do not eat any foods on this list that you are allergic to.

## 2019-06-07 NOTE — Telephone Encounter (Signed)
TC from Pt stating she needed to talk to Dr. Burr Medico in reference to her stopping the letrozole. Pt states she thinks this medication is making her blood pressure go up. Explained to Pt that she can talk to her PCP about her medication. But I will inform Dr. Burr Medico.

## 2019-06-07 NOTE — ED Triage Notes (Signed)
Pt is here with HBP issues, states she was put on new BP meds 5 weeks ago & her BP was still high. She called her  PCP & on Monday & they prescribed her different BP meds & those meds were non-effective because her BP is still high. She has a written log of her BP that shes taken, her PCP cant see her til 06/21/2019 @ 9am.

## 2019-06-07 NOTE — Telephone Encounter (Signed)
Needs to be seen today

## 2019-06-07 NOTE — Telephone Encounter (Signed)
I returned the patient's call, regarding her blood pressure issue.  She has been taking letrozole for months, she noticed her blood pressure has been high at home, she is asymptomatic.  She is currently taking amlodipine 5 mg, hydrochlorothiazide 12.5 mg daily, which was recently added.  She went to urgent care due to the concern of uncontrolled hypertension, blood pressure was 161/75 in the urgent care.  She was released to home, and recommended to continue her current medication.  She feels well, but she is concerned about her high blood pressure could be related to letrozole.  I told her it is okay to hold letrozole for months, and monitor BP at home, f/u with PCP if BP still high. If BP controlled in a month, I recommend her to restart letrozole, she agrees, and will call me if she has concerns.   Truitt Merle  06/07/2019

## 2019-06-07 NOTE — Telephone Encounter (Signed)
Pt stated that she has been taking the HCTZ that [facility] sent in. She says there has been no changes in bp (165/92 162/90 162/93 163/94 159/89). She thinks that the Letrovole 2.5 mg (she takes this once a day) might be the problem cause she read that is one of the side effects. She stated she called her Cancer Dr this morning and is waiting for a call back.  She said her bp didn't start jumping until she started taking the Letrovole.  If she stops taking that medication, does she need to continue taking the new medicine (HCTZ) or go back to the old medication?   Pt would like a call back at [phone] --ok to leave a detailed message at that number per pt

## 2019-06-07 NOTE — ED Provider Notes (Signed)
[facility]   MRN: [id number] DOB: [birth year]  Subjective:   Kristina Bridges is a 79 y.o. female presenting for persistent elevated blood pressure readings at home this past week.  She is gotten consistent readings in the 123456 systolic and 123XX123 diastolic.  Patient has been working with her PCP on her blood pressure.  She is currently taking 5 mg amlodipine.  She was just added 12.5 mg of hydrochlorothiazide this week but her pressure remains elevated.  On further questioning, it turns out that patient needs eating frozen meals regularly.  She states that these are prepackaged frozen vegetables.  Otherwise she exercises, stays active.  No current facility-administered medications for this encounter.  Current Outpatient Medications:  .  acetaminophen (TYLENOL) 500 MG tablet, Take 500 mg by mouth as needed., Disp: , Rfl:  .  amLODipine (NORVASC) 5 MG tablet, TAKE 1 AND 1/2 TABLETS(7.5 MG) BY MOUTH DAILY, Disp: 135 tablet, Rfl: 1 .  hydrochlorothiazide (HYDRODIURIL) 12.5 MG tablet, TAKE 1 TABLET(12.5 MG) BY MOUTH DAILY, Disp: 90 tablet, Rfl: 0 .  letrozole (FEMARA) 2.5 MG tablet, Take 1 tablet (2.5 mg total) by mouth daily., Disp: 30 tablet, Rfl: 3 .  Multiple Vitamin (MULTIVITAMIN) capsule, Take 1 capsule by mouth daily., Disp: , Rfl:    Allergies  Allergen Reactions  . Anastrozole     Skin rashes     Past Medical History:  Diagnosis Date  . Breast cancer ([place])    remote, tx with surgery and radiation - no recurrence  . Chicken pox   . Family history of cervical cancer   . Family history of leukemia   . Family history of prostate cancer   . Family history of throat cancer   . Hypertension   . Personal history of radiation therapy   . Seasonal allergies      Past Surgical History:  Procedure Laterality Date  . BREAST BIOPSY  1992  . BREAST LUMPECTOMY Left   . BREAST LUMPECTOMY WITH RADIOACTIVE SEED AND SENTINEL LYMPH NODE BIOPSY Right 05/15/2018   Procedure: RIGHT BREAST LUMPECTOMY WITH RADIOACTIVE SEED AND RIGHT RADIOACTIVE SEED TARGETED LYMPH NODE BIOPSY AND RIGHT SENTINEL LYMPH NODE BIOPSY;  Surgeon: Stark Klein, MD;  Location: [facility];  Service: General;  Laterality: Right;    Family History  Problem Relation Age of Onset  . Hypertension Other   . Hypertension Mother   . Cervical cancer Sister   . Cancer Brother        throat cancer   . Prostate cancer Brother   . Leukemia Daughter        CML  . Cervical cancer Daughter   . Prostate cancer Son   . Cervical cancer Sister     Social History   Tobacco Use  . Smoking status: Never Smoker  . Smokeless tobacco: Never Used  Substance Use Topics  . Alcohol use: No  . Drug use: No    ROS Denies fever, headache, confusion, vision change, weakness, chest pain, shortness of breath, belly pain, bloody urine.  Objective:   Vitals: BP (!) 161/75 (BP Location: Left Arm)   Pulse 93   Temp 98.5 F (36.9 C) (Oral)   Resp 17   Wt 133 lb (60.3 kg)   SpO2 99%   BMI 22.13 kg/m   Physical Exam Constitutional:      General: She is not in acute distress.    Appearance: Normal appearance. She is well-developed. She is not ill-appearing, toxic-appearing or diaphoretic.  HENT:     Head: Normocephalic and atraumatic.     Nose: Nose normal.     Mouth/Throat:     Mouth: Mucous membranes are moist.  Eyes:     Extraocular Movements: Extraocular movements intact.     Pupils: Pupils are equal, round, and reactive to light.  Cardiovascular:     Rate and Rhythm: Normal rate and regular rhythm.     Pulses: Normal pulses.     Heart sounds: Normal heart sounds. No murmur. No friction rub. No gallop.   Pulmonary:     Effort: Pulmonary effort is normal. No respiratory distress.     Breath sounds: Normal breath sounds. No stridor. No wheezing, rhonchi or rales.  Musculoskeletal:     Right lower leg: No edema.     Left lower leg: No edema.  Skin:    General: Skin  is warm and dry.     Findings: No rash.  Neurological:     Mental Status: She is alert and oriented to person, place, and time.     Cranial Nerves: No cranial nerve deficit.     Motor: No weakness.     Coordination: Coordination normal.     Gait: Gait normal.     Deep Tendon Reflexes: Reflexes normal.  Psychiatric:        Mood and Affect: Mood normal.        Behavior: Behavior normal.        Thought Content: Thought content normal.        Judgment: Judgment normal.     Assessment and Plan :   1. Essential hypertension   2. Elevated blood pressure reading     Patient has elevated bp and I suspect that she will get a lot of help with if she stops eating frozen foods. Maintain current bp meds. Follow up with PCP asap. No signs of ACS, stroke currently. Physical exam findings and vitals stable for discharge. Counseled patient on potential for adverse effects with medications prescribed/recommended today, ER and return-to-clinic precautions discussed, patient verbalized understanding.    Jaynee Eagles, PA-C 06/07/19 1353

## 2019-06-07 NOTE — Telephone Encounter (Signed)
I called the pt and informed her of the message below.  Our office does not have any openings available and I advised the pt to go to an urgent care close to her home.  Patient was not aware of any close to [place] and I advised she go to the [facility] at [facility].  Patient given directions to the location and agreed to go.  Message sent to PCP.

## 2019-06-07 NOTE — Telephone Encounter (Signed)
Spoke with the pt and informed her of the message below.  Patient stated she is still taking Amlodipine with HCTZ 12.5mg  and complains of a worsening headache 1 day ago since starting HCTZ, blurred vision for the past 2 days and stomach pain also.  Message sent to PCP.

## 2019-06-07 NOTE — Telephone Encounter (Signed)
Noted  

## 2019-06-07 NOTE — Telephone Encounter (Signed)
Left a message for the pt to return my call.  

## 2019-06-14 ENCOUNTER — Ambulatory Visit
Admission: RE | Admit: 2019-06-14 | Discharge: 2019-06-14 | Disposition: A | Discharge status: Home / Self Care | Payer: Medicare Other | Source: Ambulatory Visit | Attending: Hematology | Admitting: Hematology

## 2019-06-14 ENCOUNTER — Other Ambulatory Visit: Payer: Self-pay | Admitting: *Deleted

## 2019-06-14 ENCOUNTER — Other Ambulatory Visit: Payer: Self-pay

## 2019-06-14 ENCOUNTER — Other Ambulatory Visit: Payer: Self-pay | Admitting: Hematology

## 2019-06-14 DIAGNOSIS — R921 Mammographic calcification found on diagnostic imaging of breast: Secondary | ICD-10-CM

## 2019-06-14 DIAGNOSIS — Z170 Estrogen receptor positive status [ER+]: Secondary | ICD-10-CM

## 2019-06-14 DIAGNOSIS — C50411 Malignant neoplasm of upper-outer quadrant of right female breast: Secondary | ICD-10-CM

## 2019-06-14 MED ORDER — AMLODIPINE BESYLATE 5 MG PO TABS: ORAL_TABLET | ORAL | 1 refills | Status: DC

## 2019-06-19 ENCOUNTER — Ambulatory Visit: Payer: Medicare Other | Admitting: Family Medicine

## 2019-06-20 ENCOUNTER — Other Ambulatory Visit: Payer: Self-pay

## 2019-06-21 ENCOUNTER — Ambulatory Visit (INDEPENDENT_AMBULATORY_CARE_PROVIDER_SITE_OTHER): Payer: Medicare Other | Admitting: Family Medicine

## 2019-06-21 ENCOUNTER — Encounter: Payer: Self-pay | Admitting: Family Medicine

## 2019-06-21 VITALS — BP 140/90 | HR 60 | Temp 97.4°F | Ht 65.0 in | Wt 124.1 lb

## 2019-06-21 DIAGNOSIS — Z170 Estrogen receptor positive status [ER+]: Secondary | ICD-10-CM | POA: Diagnosis not present

## 2019-06-21 DIAGNOSIS — I10 Essential (primary) hypertension: Secondary | ICD-10-CM

## 2019-06-21 DIAGNOSIS — C50411 Malignant neoplasm of upper-outer quadrant of right female breast: Secondary | ICD-10-CM

## 2019-06-21 DIAGNOSIS — E785 Hyperlipidemia, unspecified: Secondary | ICD-10-CM | POA: Diagnosis not present

## 2019-06-21 MED ORDER — AMLODIPINE BESYLATE 5 MG PO TABS: ORAL_TABLET | ORAL | 1 refills | Status: DC

## 2019-06-21 NOTE — Progress Notes (Signed)
Kristina Bridges DOB: 09-30-1940 Encounter date: 06/21/2019  This is a 79 y.o. female who presents with Chief Complaint  Patient presents with  . Follow-up    History of present illness: In ER on 06/07/19 for HTN: they suggested stopping eating frozen foods. She is not eating frozen meals, just fresh frozen veggies.   HCTZ 12.5 added 06/03/19 to her daily dose of 10mg  amlodipine. Holding  Letrozole as she had concerns this might be elevating bp (ok'd by oncology). Doesn't feel like this made a difference for blood pressure. Numbers at home better, more in 120's/80's, but has seen them up to 123456 systolic. No low pressures at home. No swelling in the legs.    Allergies  Allergen Reactions  . Anastrozole     Skin rashes    Current Meds  Medication Sig  . acetaminophen (TYLENOL) 500 MG tablet Take 500 mg by mouth as needed.  Marland Kitchen amLODipine (NORVASC) 5 MG tablet Take 1 tablet PO BID  . hydrochlorothiazide (HYDRODIURIL) 12.5 MG tablet TAKE 1 TABLET(12.5 MG) BY MOUTH DAILY  . letrozole (FEMARA) 2.5 MG tablet Take 1 tablet (2.5 mg total) by mouth daily.  . Multiple Vitamin (MULTIVITAMIN) capsule Take 1 capsule by mouth daily.  . [DISCONTINUED] amLODipine (NORVASC) 5 MG tablet TAKE 1 AND 1/2 TABLETS(7.5 MG) BY MOUTH DAILY    Review of Systems  Constitutional: Negative for chills, fatigue and fever.  Respiratory: Negative for cough, chest tightness, shortness of breath and wheezing.   Cardiovascular: Negative for chest pain, palpitations and leg swelling.    Objective:  BP 140/90 (BP Location: Left Arm, Patient Position: Sitting, Cuff Size: Normal)   Pulse 60   Temp (!) 97.4 F (36.3 C) (Temporal)   Ht 5\' 5"  (1.651 m)   Wt 124 lb 1.6 oz (56.3 kg)   SpO2 98%   BMI 20.65 kg/m   Weight: 124 lb 1.6 oz (56.3 kg)   BP Readings from Last 3 Encounters:  06/21/19 140/90  06/07/19 (!) 161/75  05/03/19 (!) 161/78   Wt Readings from Last 3 Encounters:  06/21/19 124 lb 1.6 oz  (56.3 kg)  06/07/19 133 lb (60.3 kg)  05/03/19 124 lb 11.2 oz (56.6 kg)    Physical Exam Constitutional:      General: She is not in acute distress.    Appearance: She is well-developed.  Cardiovascular:     Rate and Rhythm: Normal rate and regular rhythm.     Heart sounds: Normal heart sounds. No murmur. No friction rub.  Pulmonary:     Effort: Pulmonary effort is normal. No respiratory distress.     Breath sounds: Normal breath sounds. No wheezing or rales.  Musculoskeletal:     Right lower leg: No edema.     Left lower leg: No edema.  Neurological:     Mental Status: She is alert and oriented to person, place, and time.  Psychiatric:        Behavior: Behavior normal.     Assessment/Plan 1. Essential hypertension suboptimal control. Increase amlodipine to 5mg  BID; continue hctz in morning.  - CBC with Differential/Platelet; Future - Comprehensive metabolic panel; Future - amLODipine (NORVASC) 5 MG tablet; Take 1 tablet PO BID  Dispense: 135 tablet; Refill: 1  2. Mild hyperlipidemia - Lipid panel; Future  3. Malignant neoplasm of upper-outer quadrant of right breast in female, estrogen receptor positive [facility]) Following with oncology. She will restart her medication since she has not noted improvement with blood pressure since stopping this.  Return in about 6 months (around 12/22/2019) for physical exam .     Micheline Rough, MD

## 2019-06-21 NOTE — Patient Instructions (Signed)
Drop off home blood pressures readings in about 1 months time. Just record previous 3-4 days (as long as pressures have been stable)  Change the amlodipine to 5mg  twice daily and keep hctz in the morning.

## 2019-06-28 ENCOUNTER — Telehealth: Payer: Self-pay | Admitting: *Deleted

## 2019-06-28 NOTE — Telephone Encounter (Signed)
Left a message for the pt to return my call.  

## 2019-06-28 NOTE — Telephone Encounter (Signed)
-----   Message from Caren Macadam, MD sent at 06/27/2019 10:18 PM EDT ----- I received her blood pressures from home. I would like for her to decrease number of times checking in a day to twice - one time about an hour after morning medication and the second she can do either in the afternoon/evening. Can she track for another week and let me know? If still elevated at that time I would like to increase medication, but I don't want to increase too soon and have her drop too much either.

## 2019-07-01 NOTE — Telephone Encounter (Signed)
No answer at the pts home number. 

## 2019-07-02 NOTE — Telephone Encounter (Signed)
No answer at the pts home number. 

## 2019-07-09 NOTE — Telephone Encounter (Signed)
Left a message for the pt to return my call.  

## 2019-07-09 NOTE — Telephone Encounter (Signed)
Spoke with the pt and informed her of the message below. Patient agreed to check BP twice a day and stated she will bring in the readings next week.

## 2019-07-17 ENCOUNTER — Telehealth: Payer: Self-pay | Admitting: *Deleted

## 2019-07-17 NOTE — Telephone Encounter (Signed)
Spoke with the pt and informed her of the message below.  Patient agreed to take the medications as instructed, list was updated, will check BP also and stated she will call back for a follow up appt.

## 2019-07-17 NOTE — Telephone Encounter (Signed)
Kenney Houseman gave me a note from the pt with BP readings from the pt.  Note placed on Dr Berenice Bouton desk for review.

## 2019-07-17 NOTE — Telephone Encounter (Signed)
Blood pressures are higher than desired ranging from 123456 systolic over 99991111 diastolic (those that she dropped off). I would like her to continue the norvasc 5mg  BID and then increase the hctz to 2 tabs (25mg ) daily. Please adjust med list once you speak with her. Let's set up in office visit to recheck in about 1 months time. She can decrease testing to once daily and bring 1 weeks worth of readings to next visit.

## 2019-08-06 ENCOUNTER — Telehealth: Payer: Self-pay | Admitting: Family Medicine

## 2019-08-06 NOTE — Telephone Encounter (Signed)
Pt wanted to check on her blood pressure medication refill. She has a follow up appt on 5/7 and thought maybe Dr. Ethlyn Gallery would like to see what her blood pressure is then before refilling but she only has 2 days worth of medication left and one extra pill. Please follow up with pt.

## 2019-08-06 NOTE — Telephone Encounter (Signed)
Please see how her blood pressures are doing at home?  I am happy to adjust medications if needed prior to appointment.  Please confirm current dosing of medication and make sure our med list is accurate.

## 2019-08-07 ENCOUNTER — Other Ambulatory Visit: Payer: Self-pay | Admitting: Family Medicine

## 2019-08-07 DIAGNOSIS — I10 Essential (primary) hypertension: Secondary | ICD-10-CM

## 2019-08-07 MED ORDER — HYDROCHLOROTHIAZIDE 12.5 MG PO TABS: 25.0000 mg | ORAL_TABLET | Freq: Every day | ORAL | 1 refills | Status: DC

## 2019-08-07 MED ORDER — AMLODIPINE BESYLATE 5 MG PO TABS: ORAL_TABLET | ORAL | 1 refills | Status: DC

## 2019-08-07 NOTE — Telephone Encounter (Signed)
Pt stated her bp readings have been doing great and back to normal. She is doubling up on both medications (amlodipine and hydrochlorothiazide). She has three more pills left.   4/25 133/78  4/26 128/76  4/27 134/80   Pt can be reached at [phone]

## 2019-08-07 NOTE — Telephone Encounter (Signed)
Left a message for the pt to return my call.  

## 2019-08-07 NOTE — Telephone Encounter (Signed)
Great! Refills sent in for her.

## 2019-08-07 NOTE — Telephone Encounter (Signed)
Spoke with the pt and informed her of the message below.   

## 2019-08-15 ENCOUNTER — Other Ambulatory Visit: Payer: Self-pay

## 2019-08-16 ENCOUNTER — Ambulatory Visit: Payer: Medicare Other | Admitting: Family Medicine

## 2019-08-16 NOTE — Progress Notes (Deleted)
  Kristina Bridges DOB: [birth year] Encounter date: 08/16/2019  This is a 79 y.o. female who presents with No chief complaint on file.   History of present illness: HTN: bp medications increased after high readings from home: amlodipine 5mg  BID, hctz 25mg  daily.  HPI   Allergies  Allergen Reactions  . Anastrozole     Skin rashes    No outpatient medications have been marked as taking for the 08/16/19 encounter (Appointment) with Caren Macadam, MD.    Review of Systems  Objective:  There were no vitals taken for this visit.      BP Readings from Last 3 Encounters:  06/21/19 140/90  06/07/19 (!) 161/75  05/03/19 (!) 161/78   Wt Readings from Last 3 Encounters:  06/21/19 124 lb 1.6 oz (56.3 kg)  06/07/19 133 lb (60.3 kg)  05/03/19 124 lb 11.2 oz (56.6 kg)    Physical Exam  Assessment/Plan  There are no diagnoses linked to this encounter.       Micheline Rough, MD

## 2019-08-19 ENCOUNTER — Telehealth: Payer: Self-pay | Admitting: *Deleted

## 2019-08-19 NOTE — Telephone Encounter (Signed)
-----   Message from Caren Macadam, MD sent at 08/18/2019  3:08 PM EDT ----- Unlike her to miss an appointment. Please check in with her regarding no show last week and see if we can reschedule if needed. Thanks!

## 2019-08-19 NOTE — Telephone Encounter (Signed)
Spoke with the pt and she stated she overslept for the appt last week.  Stated she rescheduled an appt for 5/17 and I advised she arrive at 10:15am.

## 2019-08-23 ENCOUNTER — Other Ambulatory Visit: Payer: Self-pay

## 2019-08-26 ENCOUNTER — Encounter: Payer: Self-pay | Admitting: Family Medicine

## 2019-08-26 ENCOUNTER — Ambulatory Visit (INDEPENDENT_AMBULATORY_CARE_PROVIDER_SITE_OTHER): Payer: Medicare Other | Admitting: Family Medicine

## 2019-08-26 VITALS — BP 160/80 | HR 68 | Temp 97.7°F | Ht 65.0 in | Wt 120.3 lb

## 2019-08-26 DIAGNOSIS — I10 Essential (primary) hypertension: Secondary | ICD-10-CM | POA: Diagnosis not present

## 2019-08-26 MED ORDER — LOSARTAN POTASSIUM 25 MG PO TABS: 12.5000 mg | ORAL_TABLET | Freq: Every day | ORAL | 1 refills | Status: DC

## 2019-08-26 NOTE — Progress Notes (Signed)
Kristina Bridges DOB: [birth year] Encounter date: 08/26/2019  This is a 79 y.o. female who presents with Chief Complaint  Patient presents with  . Follow-up    History of present illness: Rash is all over; feels it is from her last medicine the hctz. Started on arm and now is all over.  Feels like rash is more on the inside.  Gets flushed on occasion and feels redness.  Has continued to take the hydrochlorothiazide.  Blood pressures have been good at home - usually takes it an hour after breakfast and medications - usually in the high 120's-130's/80.    Allergies  Allergen Reactions  . Anastrozole     Skin rashes   . Hydrochlorothiazide     Rash; mild   Current Meds  Medication Sig  . acetaminophen (TYLENOL) 500 MG tablet Take 500 mg by mouth as needed.  Marland Kitchen amLODipine (NORVASC) 5 MG tablet Take 1 tablet PO BID  . letrozole (FEMARA) 2.5 MG tablet Take 1 tablet (2.5 mg total) by mouth daily.  . Multiple Vitamin (MULTIVITAMIN) capsule Take 1 capsule by mouth daily.  . [DISCONTINUED] hydrochlorothiazide (HYDRODIURIL) 12.5 MG tablet Take 2 tablets (25 mg total) by mouth daily.    Review of Systems  Constitutional: Negative for chills, fatigue and fever.  Respiratory: Negative for cough, chest tightness, shortness of breath and wheezing.   Cardiovascular: Negative for chest pain, palpitations and leg swelling.    Objective:  BP (!) 160/80 (BP Location: Left Arm, Patient Position: Sitting, Cuff Size: Normal)   Pulse 68   Temp 97.7 F (36.5 C) (Temporal)   Ht 5\' 5"  (1.651 m)   Wt 120 lb 4.8 oz (54.6 kg)   BMI 20.02 kg/m   Weight: 120 lb 4.8 oz (54.6 kg)   BP Readings from Last 3 Encounters:  08/26/19 (!) 160/80  06/21/19 140/90  06/07/19 (!) 161/75   Wt Readings from Last 3 Encounters:  08/26/19 120 lb 4.8 oz (54.6 kg)  06/21/19 124 lb 1.6 oz (56.3 kg)  06/07/19 133 lb (60.3 kg)    Physical Exam Constitutional:      General: She is not in acute  distress.    Appearance: She is well-developed.  Cardiovascular:     Rate and Rhythm: Normal rate and regular rhythm.     Heart sounds: Normal heart sounds. No murmur. No friction rub.  Pulmonary:     Effort: Pulmonary effort is normal. No respiratory distress.     Breath sounds: Normal breath sounds. No wheezing or rales.  Musculoskeletal:     Right lower leg: No edema.     Left lower leg: No edema.  Skin:    Comments: Subtle erythema to forearms.  Really no notable rash currently.  I did note rash at her last visit after starting hydrochlorothiazide, but it was only on the forearms at that time.  Neurological:     Mental Status: She is alert and oriented to person, place, and time.  Psychiatric:        Behavior: Behavior normal.     Assessment/Plan 1. Essential hypertension Continue amlodipine 5 mg twice daily.  Stop hydrochlorothiazide (she believes this is trigger for new rash).  Start losartan half tablet at bedtime.  She will continue to check pressures and make sure pressures are not going over 150/90 in the next 2 weeks.  She will drop off blood pressure readings in 3 weeks time.  Reviewed proper blood pressure taking technique, which she is already doing.  Continue with regular exercise.  Continue with healthy eating.  Return for pending blood pressure report.    Micheline Rough, MD

## 2019-08-26 NOTE — Patient Instructions (Addendum)
Keep taking the amlodipine 5mg  twice daily. Stop taking the hydrochlorothiazide since we think this is the one causing a rash. Start taking losartan (1/2 tablet of this at bedtime).   Check pressures at home and make sure not going over 150/90 in the next 2 weeks. Drop off (or mail) 1 weeks of pressures in 3 weeks time.

## 2019-08-29 NOTE — Progress Notes (Signed)
Kristina Bridges   [phone] [phone]) [phone]   Clinic Follow up Note   Patient Care Team: Kristina Macadam, MD as PCP - General (Family Medicine) Kristina Klein, MD as Consulting Physician (General Surgery) Kristina Merle, MD as Consulting Physician (Hematology) Kristina Pray, MD as Consulting Physician (Radiation Oncology) Kristina Feeling, NP as Nurse Practitioner (Nurse Practitioner)   Date of Service:  08/30/2019   CHIEF COMPLAINT: F/u of right breast cancer  SUMMARY OF ONCOLOGIC HISTORY: Oncology History Overview Note  Cancer Staging Malignant neoplasm of upper-outer quadrant of right breast in female, estrogen receptor positive ([place]) Staging form: Breast, AJCC 8th Edition - Clinical stage from 04/20/2018: Stage IIA (cT2, cN1, cM0, G1, ER+, PR+, HER2-) - Unsigned - Pathologic stage from 05/15/2018: Stage IB (pT2, pN1a, cM0, G2, ER+, PR+, HER2-) - Signed by Kristina Merle, MD on 05/25/2018     Malignant neoplasm of upper-outer quadrant of right breast in female, estrogen receptor positive ([place])  04/18/2018 Mammogram   Diagnostic [place] 04/18/18  IMPRESSION: Suspicious mass measuring 2.4 x 1.3 centimeters in the retroareolar region of the RIGHT breast  Enlarged RIGHT axillary lymph node.   04/20/2018 Initial Biopsy   Diagnosis 04/20/18 1. Breast, right, needle core biopsy, 11 o'clock - INVASIVE DUCTAL CARCINOMA. - SEE COMMENT. 2. Lymph node, needle/core biopsy, right axilla - METASTATIC CARCINOMA IN 1 OF 1 LYMPH NODE (1/1). - SEE COMMENT. Microscopic Comment 1. The carcinoma appears grade I. A breast prognostic profile will be performed and the results reported separately. 2. The carcinoma in part 2 is somewhat morphologically dissimilar from that seen in part 1. A breast prognostic profile will also be performed on part 2 and the results reported separately. The results were called to [facility] on 04/23/2018. (JBK:ecj 04/23/2018)     04/20/2018 Receptors her2   IMMUNOHISTOCHEMICAL AND MORPHOMETRIC ANALYSIS PERFORMED MANUALLY The tumor cells are NEGATIVE for Her2 (1+). Estrogen Receptor: 100%, POSITIVE, STRONG STAINING INTENSITY Progesterone Receptor: 50%, POSITIVE, STRONG STAINING INTENSITY Proliferation Marker Ki67: 40%   04/27/2018 Initial Diagnosis   Malignant neoplasm of upper-outer quadrant of right breast in female, estrogen receptor positive ([place])   05/15/2018 Surgery   RIGHT BREAST LUMPECTOMY WITH RADIOACTIVE SEED AND RIGHT RADIOACTIVE SEED TARGETED LYMPH NODE BIOPSY AND RIGHT SENTINEL LYMPH NODE BIOPSY by Dr. Barry Dienes  05/15/18    05/15/2018 Pathology Results   Diagnosis 1. Breast, lumpectomy, Right w/seed - INVASIVE DUCTAL CARCINOMA, GRADE II, 3.7 CM. - DUCTAL CARCINOMA IN SITU, INTERMEDIATE NUCLEAR GRADE. - LOBULAR CARCINOMA IN SITU. - CARCINOMA IS 1 MM FROM THE POSTERIOR MARGIN AND 4 MM FROM THE SUPERIOR MARGIN. SEE NOTE - CARCINOMA FOCALLY INVOLVES SUBAREOLAR DERMIS BUT NIPPLE IS NOT INVOLVED. - DUCTAL CARCINOMA IN SITU IS 0.5 CM FROM THE MEDIAL MARGIN - NEGATIVE FOR LYMPHOVASCULAR OR PERINEURAL INVASION. - BIOPSY SITE CHANGES. - SEE ONCOLOGY TABLE 2. Lymph node, sentinel, biopsy, Right axillary 1&2 with seed - METASTATIC DUCTAL CARCINOMA TO A LYMPH NODE (1/1). - METASTATIC FOCUS MEASURES 2.3 CM AND SHOWS EXTRANODAL EXTENSION. 3. Lymph node, sentinel, biopsy, Right - LYMPH NODE, NEGATIVE FOR CARCINOMA (0/1). - IMMUNOSTAIN FOR PANKERATIN (AE1/AE3) IS NEGATIVE 4. Lymph node, sentinel, biopsy, Right axillary #3 - LYMPH NODE, NEGATIVE FOR CARCINOMA (0/1). - IMMUNOSTAIN FOR PANKERATIN (AE1/AE3) IS NEGATIVE    05/15/2018 Cancer Staging   Staging form: Breast, AJCC 8th Edition - Pathologic stage from 05/15/2018: Stage IB (pT2, pN1a, cM0, G2, ER+, PR+, HER2-) - Signed by Kristina Merle, MD on 05/25/2018  07/03/2018 - 08/20/2018 Radiation Therapy   adjuvant radiation 07/03/18-08/20/18 by Dr. Sondra Come   11/08/2018 -   Anti-estrogen oral therapy   Adjuvant Anastrozole 41m daily starting 10/24/18. Due to reaction with skin rash, This was stopped 1 week later. She declined exemestane due to high copay.  She was switched to Letrozole in 11/2018 but she never took it. She started in 04/2019.       CURRENT THERAPY:  AdjuvantAnastrozole166mdailystarting 10/24/18. Due to reaction with skin rash, This was stopped 1 week later. She was switched to Letrozole in 11/2018 but she never took it. She started in 04/2019.   INTERVAL HISTORY:  WiPatton Bridges here for a follow up of right breast cancer. She presents to the clinic alone. She notes she was told twice that she was to come in visit when she was scheduled for phone visit. She was on Letrozole for a few days and her blood pressure kept increasing so she stopped taking it because she wasn't sure if it was related. She was recently started on Losartan to better control her HTN. Prior to losartan she was on a HTN medication that caused her hives. This was before starting Letrozole. She would like to get her BP under control before restarting Letrozole again. She notes she still exercises inside and walks miles outside.    REVIEW OF SYSTEMS:   Constitutional: Denies fevers, chills or abnormal weight loss Eyes: Denies blurriness of vision Ears, nose, mouth, throat, and face: Denies mucositis or sore throat Respiratory: Denies cough, dyspnea or wheezes Cardiovascular: Denies palpitation, chest discomfort or lower extremity swelling Gastrointestinal:  Denies nausea, heartburn or change in bowel habits Skin: Denies abnormal skin rashes Lymphatics: Denies new lymphadenopathy or easy bruising Neurological:Denies numbness, tingling or new weaknesses Behavioral/Psych: Mood is stable, no new changes  All other systems were reviewed with the patient and are negative.  MEDICAL HISTORY:  Past Medical History:  Diagnosis Date  . [place]  with surgery and radiation - no recurrence  . Chicken pox   . Family history of cervical cancer   . Family history of leukemia   . Family history of prostate cancer   . Family history of throat cancer   . Hypertension   . Personal history of radiation therapy   . Seasonal allergies     SURGICAL HISTORY: Past Surgical History:  Procedure Laterality Date  . BREAST BIOPSY  1992  . BREAST LUMPECTOMY Left   . BREAST LUMPECTOMY WITH RADIOACTIVE SEED AND SENTINEL LYMPH NODE BIOPSY Right 05/15/2018   Procedure: RIGHT BREAST LUMPECTOMY WITH RADIOACTIVE SEED AND RIGHT RADIOACTIVE SEED TARGETED LYMPH NODE BIOPSY AND RIGHT SENTINEL LYMPH NODE BIOPSY;  Surgeon: ByStark KleinMD;  [facility] Service: General;  Laterality: Right;    I have reviewed the social history and family history with the patient and they are unchanged from previous note.  ALLERGIES:  is allergic to anastrozole and hydrochlorothiazide.  MEDICATIONS:  Current Outpatient Medications  Medication Sig Dispense Refill  . acetaminophen (TYLENOL) 500 MG tablet Take 500 mg by mouth as needed.    . Marland KitchenmLODipine (NORVASC) 5 MG tablet Take 1 tablet PO BID 180 tablet 1  . letrozole (FEMARA) 2.5 MG tablet Take 1 tablet (2.5 mg total) by mouth daily. 30 tablet 3  . losartan (COZAAR) 25 MG tablet Take 0.5 tablets (12.5 mg total) by mouth daily. 90 tablet 1  . Multiple Vitamin (MULTIVITAMIN) capsule Take  1 capsule by mouth daily.     No current facility-administered medications for this visit.    PHYSICAL EXAMINATION: ECOG PERFORMANCE STATUS: 0 - Asymptomatic  Vitals:   08/30/19 1056 08/30/19 1057  BP: (!) 165/73 (!) 148/77  Pulse: 80   Resp: 18   Temp: 98.1 F (36.7 C)   SpO2: 100%    Filed Weights   08/30/19 1056  Weight: 123 lb 11.2 oz (56.1 kg)    GENERAL:alert, no distress and comfortable SKIN: skin color, texture, turgor are normal, no rashes or significant lesions EYES: normal, Conjunctiva  are pink and non-injected, sclera clear  NECK: supple, thyroid normal size, non-tender, without nodularity LYMPH:  no palpable lymphadenopathy in the cervical, axillary  LUNGS: clear to auscultation and percussion with normal breathing effort HEART: regular rate & rhythm and no murmurs and no lower extremity edema ABDOMEN:abdomen soft, non-tender and normal bowel sounds Musculoskeletal:no cyanosis of digits and no clubbing  NEURO: alert & oriented x 3 with fluent speech, no focal motor/sensory deficits BREAST: S/p right lumpectomy: Surgical incision healed well. (+) Lumpy breast tissue in UOQ near incision.  No palpable mass, nodules or adenopathy bilaterally. Breast exam benign.    LABORATORY DATA:  I have reviewed the data as listed CBC Latest Ref Rng & Units 05/03/2019 10/24/2018 05/02/2018  WBC 4.0 - 10.5 K/uL 6.8 6.0 8.5  Hemoglobin 12.0 - 15.0 g/dL 14.0 12.6 13.7  Hematocrit 36.0 - 46.0 % 42.6 39.6 42.6  Platelets 150 - 400 K/uL 222 214 267     CMP Latest Ref Rng & Units 05/03/2019 11/12/2018 10/24/2018  Glucose 70 - 99 mg/dL 86 78 89  BUN 8 - 23 mg/dL 17 14 27(H)  Creatinine 0.44 - 1.00 mg/dL 0.82 0.68 1.33(H)  Sodium 135 - 145 mmol/L 140 137 138  Potassium 3.5 - 5.1 mmol/L 4.7 4.0 4.0  Chloride 98 - 111 mmol/L 103 101 104  CO2 22 - 32 mmol/L _0 Calcium 8.9 - 10.3 mg/dL 9.4 9.9 8.3(L)  Total Protein 6.5 - 8.1 g/dL 7.2 - 7.0  Total Bilirubin 0.3 - 1.2 mg/dL 0.8 - 0.5  Alkaline Phos 38 - 126 U/L 103 - 95  AST 15 - 41 U/L 17 - 18  ALT 0 - 44 U/L 9 - 11      RADIOGRAPHIC STUDIES: I have personally reviewed the radiological images as listed and agreed with the findings in the report. No results found.   ASSESSMENT & PLAN:  Kristina Bridges is a 79 y.o. female with    1.Malignant neoplasm of upper-outer quadrant of right breast, StageIB, pT2N1aM0, ER+/PR/HER2-, Kristina Bridges -She was diagnosed in 04/2018. She is s/prightbreast lumpectomy with SLNB and  adjuvant radiation. She tolerated very well. Pt declined mammaprint test for risk stratification, she is not interested in chemo  -She started antiestrogen therapy with Anastrozole in 10/2018. Due to reaction with skin rash, This was stopped 1 week later. She started Letrozole in 04/2019.  -She stopped Letrozole after a few days due to hypertension. She would like to better control her HTN and has been following with her primary care physician for medication adjustment. She Is willing to restart when her HTN is better controlled. I encouraged her to restart Letrozole after her HTN is doing well for 1 month, she is agreeable.  -From a breast cancer standpoint She is clinically doing well. Lab reviewed, her CBC and CMP are within normal limits. Her physical exam stable. She plans to have f/u left  mammogram for her left breast calcifications.  -Continue Surveillance -I will call her in 4 months. OV in 04/2020 as previously scheduled.    2.H/oLeftbreast cancer -She was diagnosedin 1992. Treated with Leftbreast lumpectomyand radiation in [place].  -Given this is her second breast cancer and strong family history of cancer she is eligible for genetic cancer, but she declined.  3. HTN -ContinueAmlodipineand follow-up with PCP -Not well controlled. I encouraged her to monitor her BP at home and f/u with PCP for possible medication adjustment.   4. Bone Health  -She has not had a bone density scan before as she declined repeating.  -I again discussed AI can weaken her bone, so I recommended she get a baseline DEXA to monitor. She declined.  -I encouraged her to take Calcium and Vit D.    PLAN:  -Left mammogram 12/17/19 -Virtual visit in 4 months  -Lab and F/u in 04/2020 as scheduled  -pt is willing to restart Letrozole when her blood pressure is better controlled.  No problem-specific Assessment & Plan notes found for this encounter.   No orders of the defined types were placed in  this encounter.   All questions were answered. The patient knows to call the clinic with any problems, questions or concerns. No barriers to learning was detected. The total time spent in the appointment was 20 minutes.    Kristina Merle, MD 08/30/2019   I, Joslyn Devon, am acting as scribe for Kristina Merle, MD.   I have reviewed the above documentation for accuracy and completeness, and I agree with the above.

## 2019-08-30 ENCOUNTER — Encounter: Payer: Self-pay | Admitting: Hematology

## 2019-08-30 ENCOUNTER — Inpatient Hospital Stay: Payer: Medicare Other | Attending: Hematology | Admitting: Hematology

## 2019-08-30 ENCOUNTER — Other Ambulatory Visit: Payer: Self-pay

## 2019-08-30 VITALS — BP 148/77 | HR 80 | Temp 98.1°F | Resp 18 | Ht 65.0 in | Wt 123.7 lb

## 2019-08-30 DIAGNOSIS — I10 Essential (primary) hypertension: Secondary | ICD-10-CM | POA: Insufficient documentation

## 2019-08-30 DIAGNOSIS — Z170 Estrogen receptor positive status [ER+]: Secondary | ICD-10-CM | POA: Insufficient documentation

## 2019-08-30 DIAGNOSIS — C50411 Malignant neoplasm of upper-outer quadrant of right female breast: Secondary | ICD-10-CM

## 2019-08-30 DIAGNOSIS — Z79899 Other long term (current) drug therapy: Secondary | ICD-10-CM | POA: Insufficient documentation

## 2019-08-30 DIAGNOSIS — Z79811 Long term (current) use of aromatase inhibitors: Secondary | ICD-10-CM | POA: Insufficient documentation

## 2019-09-20 ENCOUNTER — Telehealth: Payer: Self-pay | Admitting: *Deleted

## 2019-09-20 NOTE — Telephone Encounter (Signed)
Left a message for the pt to return my call.  

## 2019-09-20 NOTE — Telephone Encounter (Signed)
-----   Message from Caren Macadam, MD sent at 09/20/2019  8:50 AM EDT ----- I received her home blood pressures. I do think that her home readings tend to be a little lower than our in office readings. I would like for her to continue the amlodipine 5mg  BID and then increase the losartan in the evening to a whole tablet (25mg ). Follow up in office (with home cuff) in 2 months for recheck. Thanks!

## 2019-09-23 ENCOUNTER — Telehealth: Payer: Self-pay | Admitting: Family Medicine

## 2019-09-23 NOTE — Telephone Encounter (Signed)
Noted thanks °

## 2019-09-23 NOTE — Telephone Encounter (Signed)
Patient was returning call to let Dr. Ethlyn Gallery know that she is doing real good, that she is able to stay active and feel fine while doing it.  If any changes before scheduled appointment she states she will give [place] a call.

## 2019-12-17 ENCOUNTER — Other Ambulatory Visit: Payer: Self-pay | Admitting: Hematology

## 2019-12-17 ENCOUNTER — Other Ambulatory Visit: Payer: Self-pay

## 2019-12-17 ENCOUNTER — Ambulatory Visit
Admission: RE | Admit: 2019-12-17 | Discharge: 2019-12-17 | Disposition: A | Discharge status: Home / Self Care | Payer: Medicare Other | Source: Ambulatory Visit | Attending: Hematology | Admitting: Hematology

## 2019-12-17 DIAGNOSIS — R921 Mammographic calcification found on diagnostic imaging of breast: Secondary | ICD-10-CM

## 2019-12-17 DIAGNOSIS — R928 Other abnormal and inconclusive findings on diagnostic imaging of breast: Secondary | ICD-10-CM | POA: Diagnosis not present

## 2019-12-17 DIAGNOSIS — Z853 Personal history of malignant neoplasm of breast: Secondary | ICD-10-CM | POA: Diagnosis not present

## 2019-12-17 DIAGNOSIS — N6320 Unspecified lump in the left breast, unspecified quadrant: Secondary | ICD-10-CM

## 2019-12-23 ENCOUNTER — Ambulatory Visit (INDEPENDENT_AMBULATORY_CARE_PROVIDER_SITE_OTHER): Payer: Medicare Other | Admitting: Family Medicine

## 2019-12-23 ENCOUNTER — Other Ambulatory Visit: Payer: Self-pay

## 2019-12-23 ENCOUNTER — Encounter: Payer: Self-pay | Admitting: Family Medicine

## 2019-12-23 VITALS — BP 142/90 | HR 78 | Temp 98.2°F | Ht 65.0 in | Wt 128.4 lb

## 2019-12-23 DIAGNOSIS — Z1159 Encounter for screening for other viral diseases: Secondary | ICD-10-CM | POA: Diagnosis not present

## 2019-12-23 DIAGNOSIS — E785 Hyperlipidemia, unspecified: Secondary | ICD-10-CM

## 2019-12-23 DIAGNOSIS — I10 Essential (primary) hypertension: Secondary | ICD-10-CM

## 2019-12-23 MED ORDER — LOSARTAN POTASSIUM 25 MG PO TABS: 25.0000 mg | ORAL_TABLET | Freq: Every day | ORAL | 1 refills | Status: DC

## 2019-12-23 NOTE — Progress Notes (Signed)
Kristina Bridges DOB: 11-02-1940 Encounter date: 12/23/2019  This is a 79 y.o. female who presents with Chief Complaint  Patient presents with  . Follow-up    History of present illness: Last visit was in May.  At that time we continued amlodipine 5 mg twice a day.  We had stopped hydrochlorothiazide because she thought she got a rash from this.  We started losartan at bedtime. Energy level is doing well. She is staying very busy. Still walking regularly. She is covering the "worthwhile" club through church. Staying busy helping everyone else.    Brought in home blood pressure report;   Got a little kitten that followed her home from walk. Named it shadow. Keeps her on patio.    Allergies  Allergen Reactions  . Anastrozole     Skin rashes   . Hydrochlorothiazide     Rash; mild   Current Meds  Medication Sig  . acetaminophen (TYLENOL) 500 MG tablet Take 500 mg by mouth as needed.  Marland Kitchen amLODipine (NORVASC) 5 MG tablet Take 1 tablet PO BID  . letrozole (FEMARA) 2.5 MG tablet Take 1 tablet (2.5 mg total) by mouth daily.  Marland Kitchen losartan (COZAAR) 25 MG tablet Take 1 tablet (25 mg total) by mouth daily.  . Multiple Vitamin (MULTIVITAMIN) capsule Take 1 capsule by mouth daily.  . [DISCONTINUED] losartan (COZAAR) 25 MG tablet Take 0.5 tablets (12.5 mg total) by mouth daily.    Review of Systems  Constitutional: Negative for chills, fatigue and fever.  Respiratory: Negative for cough, chest tightness, shortness of breath and wheezing.   Cardiovascular: Negative for chest pain, palpitations and leg swelling.    Objective:  BP (!) 142/90 (BP Location: Left Arm, Patient Position: Sitting, Cuff Size: Normal)   Pulse 78   Temp 98.2 F (36.8 C) (Oral)   Ht 5\' 5"  (1.651 m)   Wt 128 lb 6.4 oz (58.2 kg)   BMI 21.37 kg/m   Weight: 128 lb 6.4 oz (58.2 kg)   BP Readings from Last 3 Encounters:  12/23/19 (!) 142/90  08/30/19 (!) 148/77  08/26/19 (!) 160/80   Wt Readings from  Last 3 Encounters:  12/23/19 128 lb 6.4 oz (58.2 kg)  08/30/19 123 lb 11.2 oz (56.1 kg)  08/26/19 120 lb 4.8 oz (54.6 kg)    Physical Exam Constitutional:      General: She is not in acute distress.    Appearance: She is well-developed.  Cardiovascular:     Rate and Rhythm: Normal rate and regular rhythm.     Heart sounds: Normal heart sounds. No murmur heard.  No friction rub.  Pulmonary:     Effort: Pulmonary effort is normal. No respiratory distress.     Breath sounds: Normal breath sounds. No wheezing or rales.  Musculoskeletal:     Right lower leg: No edema.     Left lower leg: No edema.  Neurological:     Mental Status: She is alert and oriented to person, place, and time.  Psychiatric:        Behavior: Behavior normal.     Assessment/Plan  1. Essential hypertension We are going to keep amlodipien 5mg  BID but increase losartan to 25mg  in the evening. Continue to monitor pressures. Home readings mostely in high 585'I systolic over mid 77'O diastolic.  - CBC with Differential/Platelet; Future - Comprehensive metabolic panel; Future - Comprehensive metabolic panel - CBC with Differential/Platelet  2. Mild hyperlipidemia Recheck bloodwork today.  - Lipid panel; Future - Lipid  panel  3. Encounter for hepatitis C screening test for low risk patient - Hepatitis C antibody; Future - Hepatitis C antibody   Patient declines bone density.  She does want to get COVID shot after consideration and states she will go this week. She declines pneumonia shot, flu shot, shingles shot.    Micheline Rough, MD

## 2019-12-24 LAB — COMPREHENSIVE METABOLIC PANEL
AG Ratio: 1.6 (calc) (ref 1.0–2.5)
ALT: 10 U/L (ref 6–29)
AST: 17 U/L (ref 10–35)
Albumin: 4.2 g/dL (ref 3.6–5.1)
Alkaline phosphatase (APISO): 102 U/L (ref 37–153)
BUN: 20 mg/dL (ref 7–25)
CO2: 29 mmol/L (ref 20–32)
Calcium: 9.4 mg/dL (ref 8.6–10.4)
Chloride: 101 mmol/L (ref 98–110)
Creat: 0.88 mg/dL (ref 0.60–0.93)
Globulin: 2.6 g/dL (calc) (ref 1.9–3.7)
Glucose, Bld: 84 mg/dL (ref 65–99)
Potassium: 4.4 mmol/L (ref 3.5–5.3)
Sodium: 137 mmol/L (ref 135–146)
Total Bilirubin: 0.6 mg/dL (ref 0.2–1.2)
Total Protein: 6.8 g/dL (ref 6.1–8.1)

## 2019-12-24 LAB — LIPID PANEL
Cholesterol: 194 mg/dL (ref <200)
HDL: 72 mg/dL (ref >=50)
LDL Cholesterol (Calc): 108 mg/dL (calc) — ABNORMAL HIGH
Non-HDL Cholesterol (Calc): 122 mg/dL (calc) (ref <130)
Total CHOL/HDL Ratio: 2.7 (calc) (ref <5.0)
Triglycerides: 58 mg/dL (ref <150)

## 2019-12-24 LAB — CBC WITH DIFFERENTIAL/PLATELET
Absolute Monocytes: 632 cells/uL (ref 200–950)
Basophils Absolute: 48 cells/uL (ref 0–200)
Basophils Relative: 0.7 %
Eosinophils Absolute: 20 cells/uL (ref 15–500)
Eosinophils Relative: 0.3 %
HCT: 39.9 % (ref 35.0–45.0)
Hemoglobin: 13.2 g/dL (ref 11.7–15.5)
Lymphs Abs: 1693 cells/uL (ref 850–3900)
MCH: 30.8 pg (ref 27.0–33.0)
MCHC: 33.1 g/dL (ref 32.0–36.0)
MCV: 93.2 fL (ref 80.0–100.0)
MPV: 9.9 fL (ref 7.5–12.5)
Monocytes Relative: 9.3 %
Neutro Abs: 4406 cells/uL (ref 1500–7800)
Neutrophils Relative %: 64.8 %
Platelets: 262 10*3/uL (ref 140–400)
RBC: 4.28 10*6/uL (ref 3.80–5.10)
RDW: 11.4 % (ref 11.0–15.0)
Total Lymphocyte: 24.9 %
WBC: 6.8 10*3/uL (ref 3.8–10.8)

## 2019-12-25 ENCOUNTER — Telehealth: Payer: Self-pay | Admitting: Family Medicine

## 2019-12-25 NOTE — Telephone Encounter (Signed)
See results note. 

## 2019-12-25 NOTE — Telephone Encounter (Signed)
Pt was returning [place] call about results [phone]

## 2020-01-01 ENCOUNTER — Ambulatory Visit: Payer: Medicare Other | Admitting: Hematology

## 2020-01-27 ENCOUNTER — Other Ambulatory Visit: Payer: Self-pay | Admitting: Family Medicine

## 2020-01-27 ENCOUNTER — Telehealth: Payer: Self-pay

## 2020-01-27 MED ORDER — LOSARTAN POTASSIUM 25 MG PO TABS: 25.0000 mg | ORAL_TABLET | Freq: Every day | ORAL | 1 refills | Status: DC

## 2020-01-27 NOTE — Telephone Encounter (Signed)
I have corrected error so she should be able to get 90 day supply now of the 25mg . Please make sure pressures are running well (would like them 135/85 or lower)

## 2020-01-27 NOTE — Telephone Encounter (Signed)
Patient called in wanting her prescription to be fixed she was only able to get 45 pills instead of her normally 71. Pharmacy also made her wait 5 days to get her medicine because of the Rx change  Patient is wanting this to be fixed so she won't have any issues when she goes to refill again   losartan (COZAAR) 25 MG tablet  [facility], [place] - [address] AT [address]

## 2020-01-28 NOTE — Telephone Encounter (Signed)
Left a detailed message at the pts home number with the information below. 

## 2020-02-04 IMAGING — MG MM CLIP PLACEMENT
2 series · 2 of 2 positions shown · non-contrast
Comparison: Previous exam(s).

CLINICAL DATA: Two radioactive seeds were placed today, including
in a recently biopsy-proven malignancy in the retroareolar right
breast and a biopsy-proven metastasis to right axillary lymph node.

EXAM:
DIAGNOSTIC RIGHT MAMMOGRAM POST ULTRASOUND GUIDED RADIOACTIVE SEED
PLACEMENT X2

[R MLO]
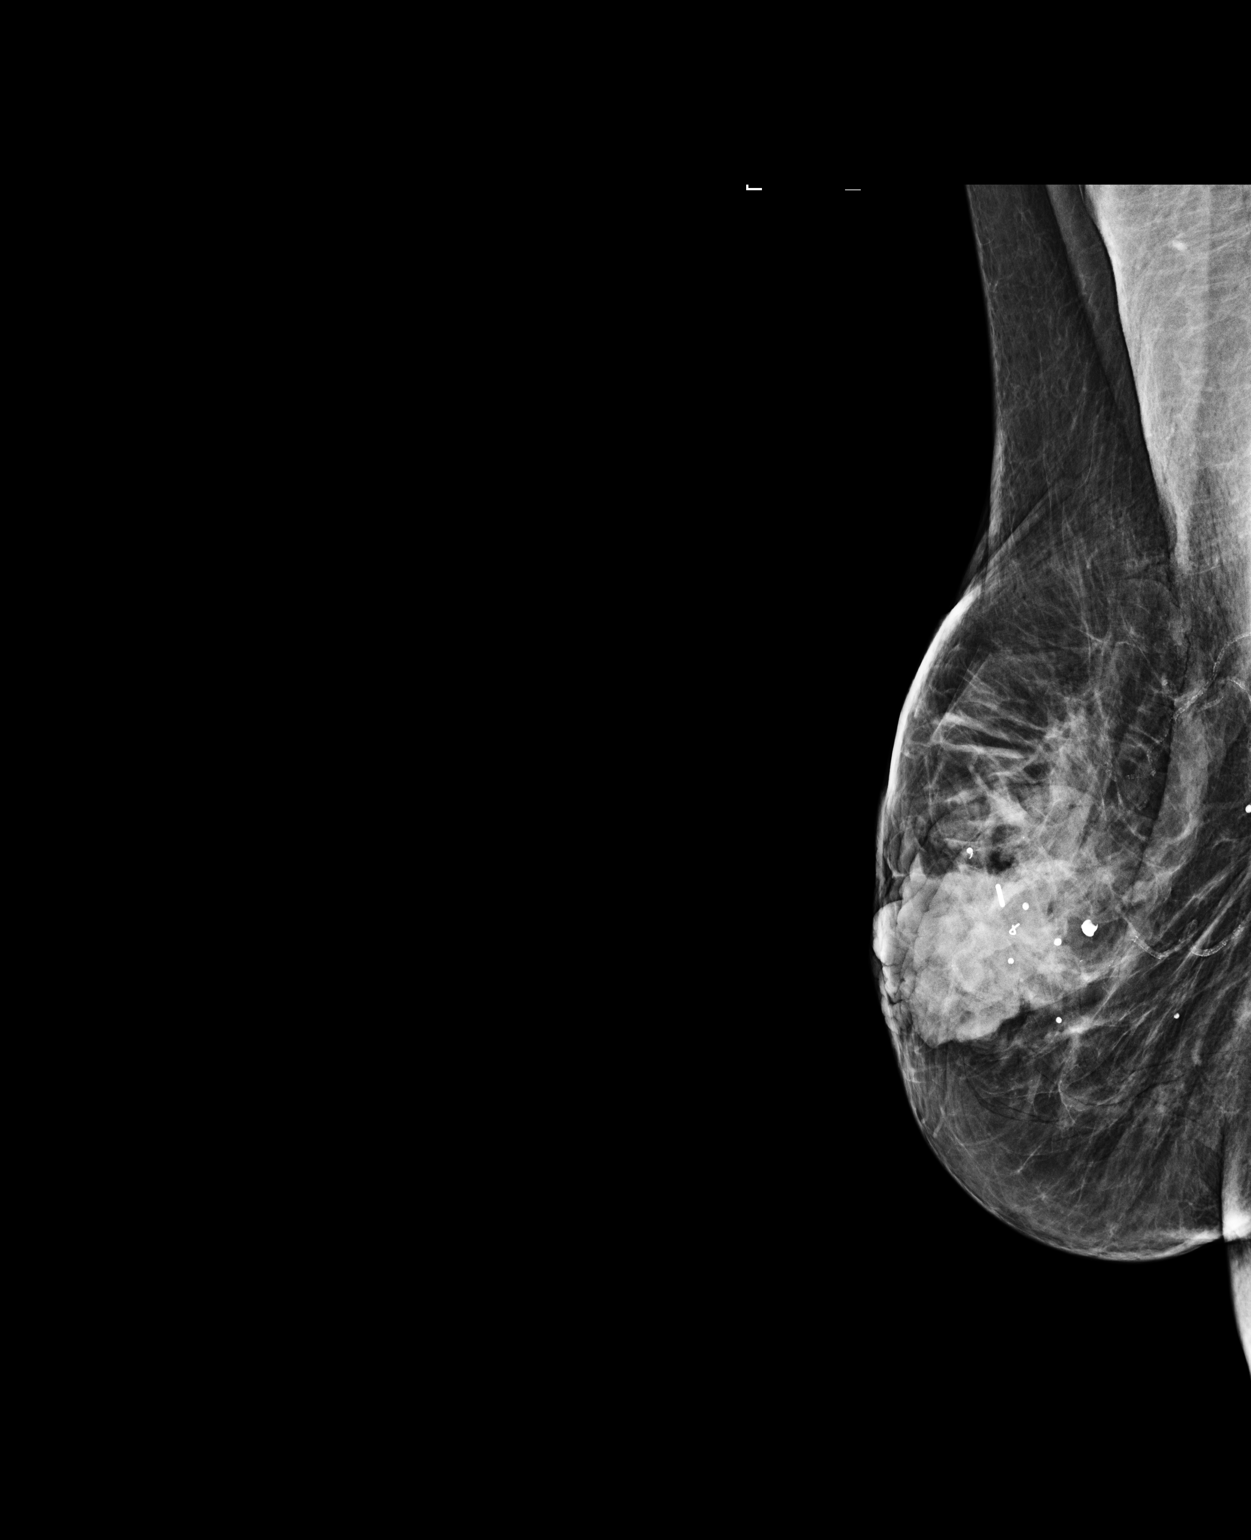

[R CC]
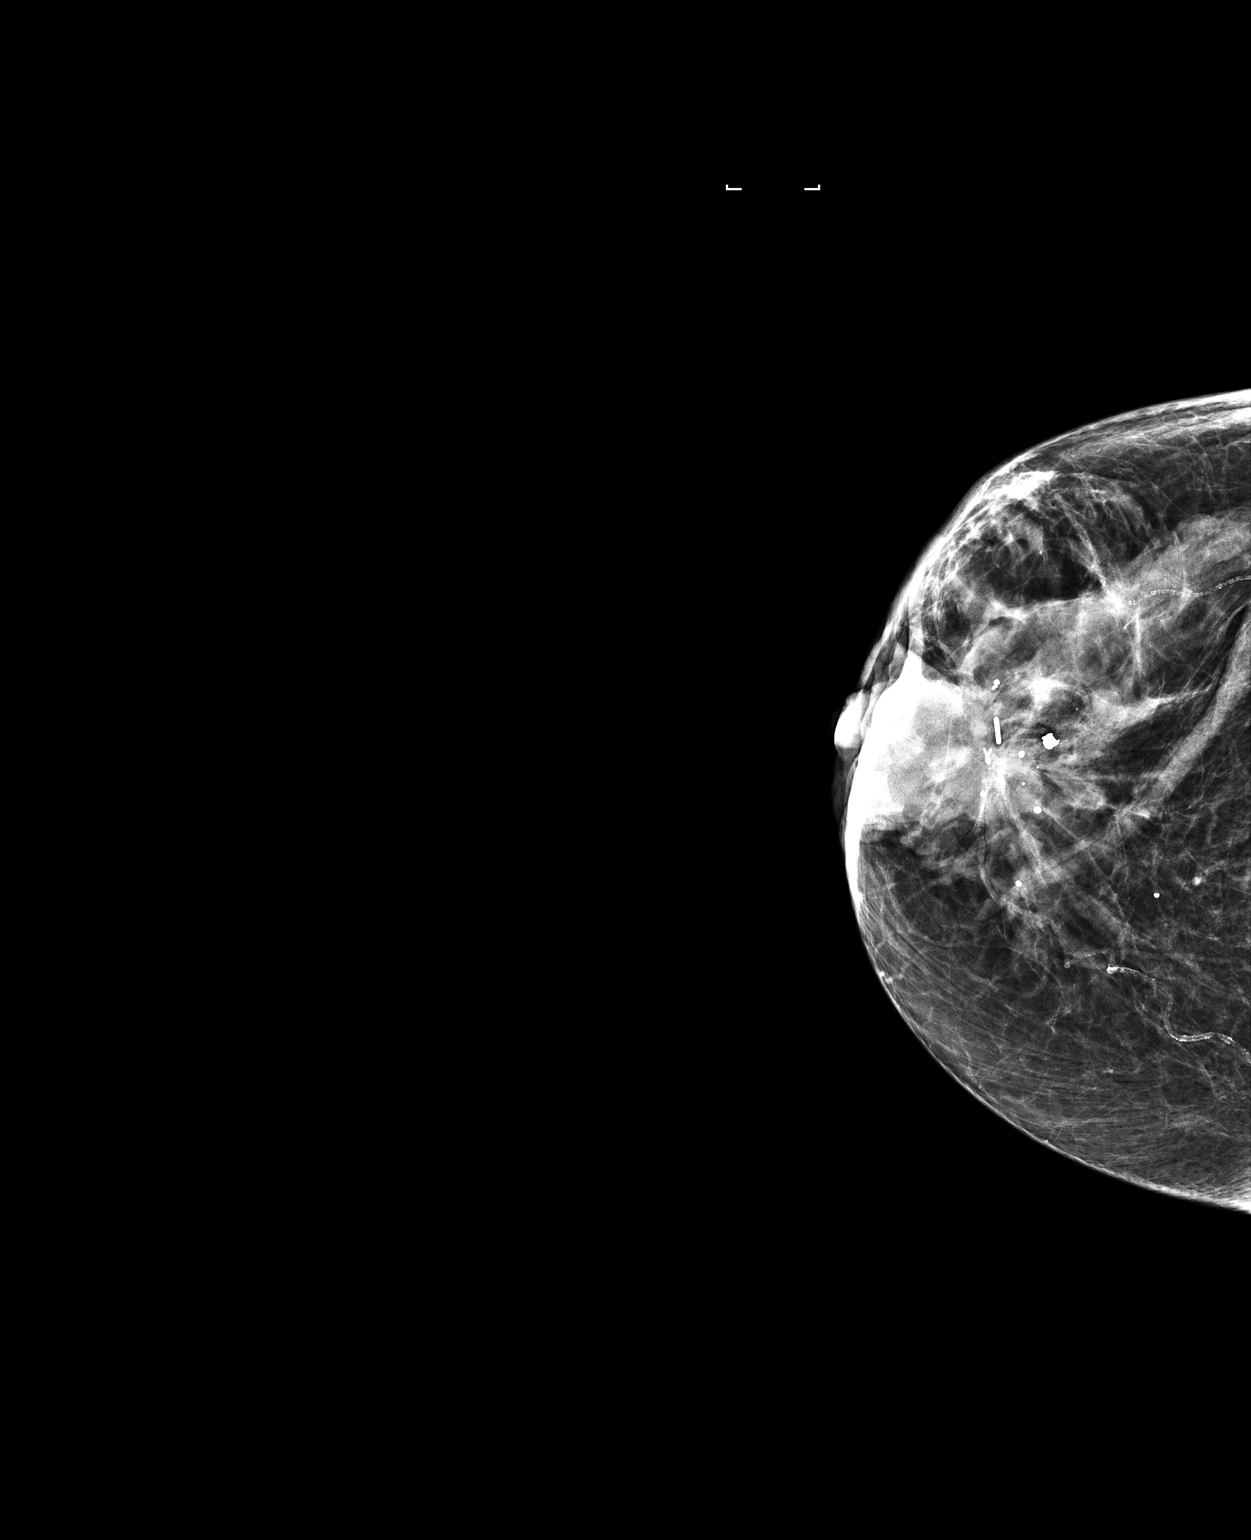

[2 of 2 positions shown; findings below may reference images not displayed]

FINDINGS: Mammographic images were obtained following ultrasound guided
radioactive seed placement. A radioactive seed is satisfactorily
positioned within the biopsy-proven retroareolar right breast mass.
Please note that the seed is within the more posterior aspect of the
mass. The mass does extend into the immediate retroareolar area and
is palpable.

The biopsied right axillary lymph node is not mammographically
visible and therefore the radioactive seed placed within that lymph
node is not seen on the mammogram.
IMPRESSION: Satisfactory position of radioactive seed within the biopsy-proven
malignancy in the retroareolar right breast. The biopsy-proven
metastatic right axillary lymph node is not visible on mammography.

Final Assessment: Post Procedure Mammograms for Marker Placement

## 2020-02-05 IMAGING — MG BREAST SURGICAL SPECIMEN
1 series · 1 of 1 positions shown · non-contrast
Comparison: Previous exam(s).

CLINICAL DATA: Status post RIGHT breast lumpectomy today after
earlier radioactive seed localization.

EXAM:
SPECIMEN RADIOGRAPH OF THE RIGHT BREAST

[R]
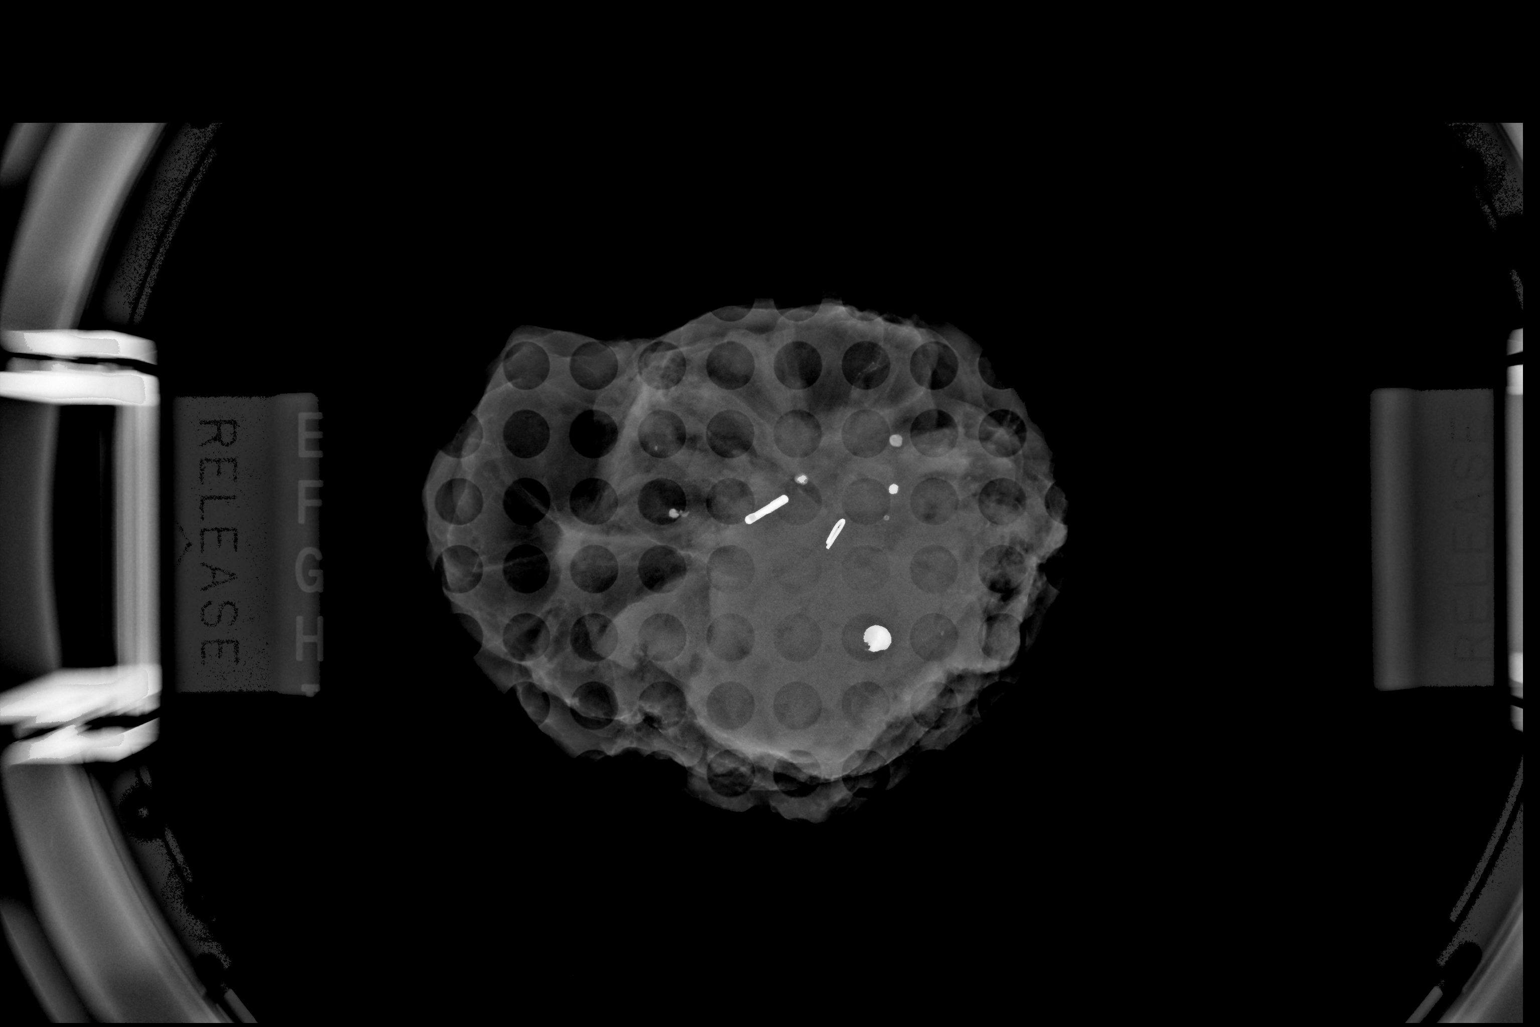

[1 of 1 positions shown; findings below may reference images not displayed]

FINDINGS: Status post excision of the RIGHT breast. The radioactive seed and
biopsy marker clip are present, completely intact, and were marked
for pathology. The locations of the radioactive seed and biopsy
marker clip within the specimen were discussed with the OR staff
during the procedure.
IMPRESSION: Specimen radiograph of the right breast.

## 2020-02-05 IMAGING — MG MM BREAST SURGICAL SPECIMEN
1 series · 1 of 1 positions shown · non-contrast
Comparison: Previous exam(s).

CLINICAL DATA: Status post RIGHT axillary lymph node dissection
after earlier radioactive seed localization.

EXAM:
SPECIMEN RADIOGRAPH OF THE RIGHT AXILLA

[R]
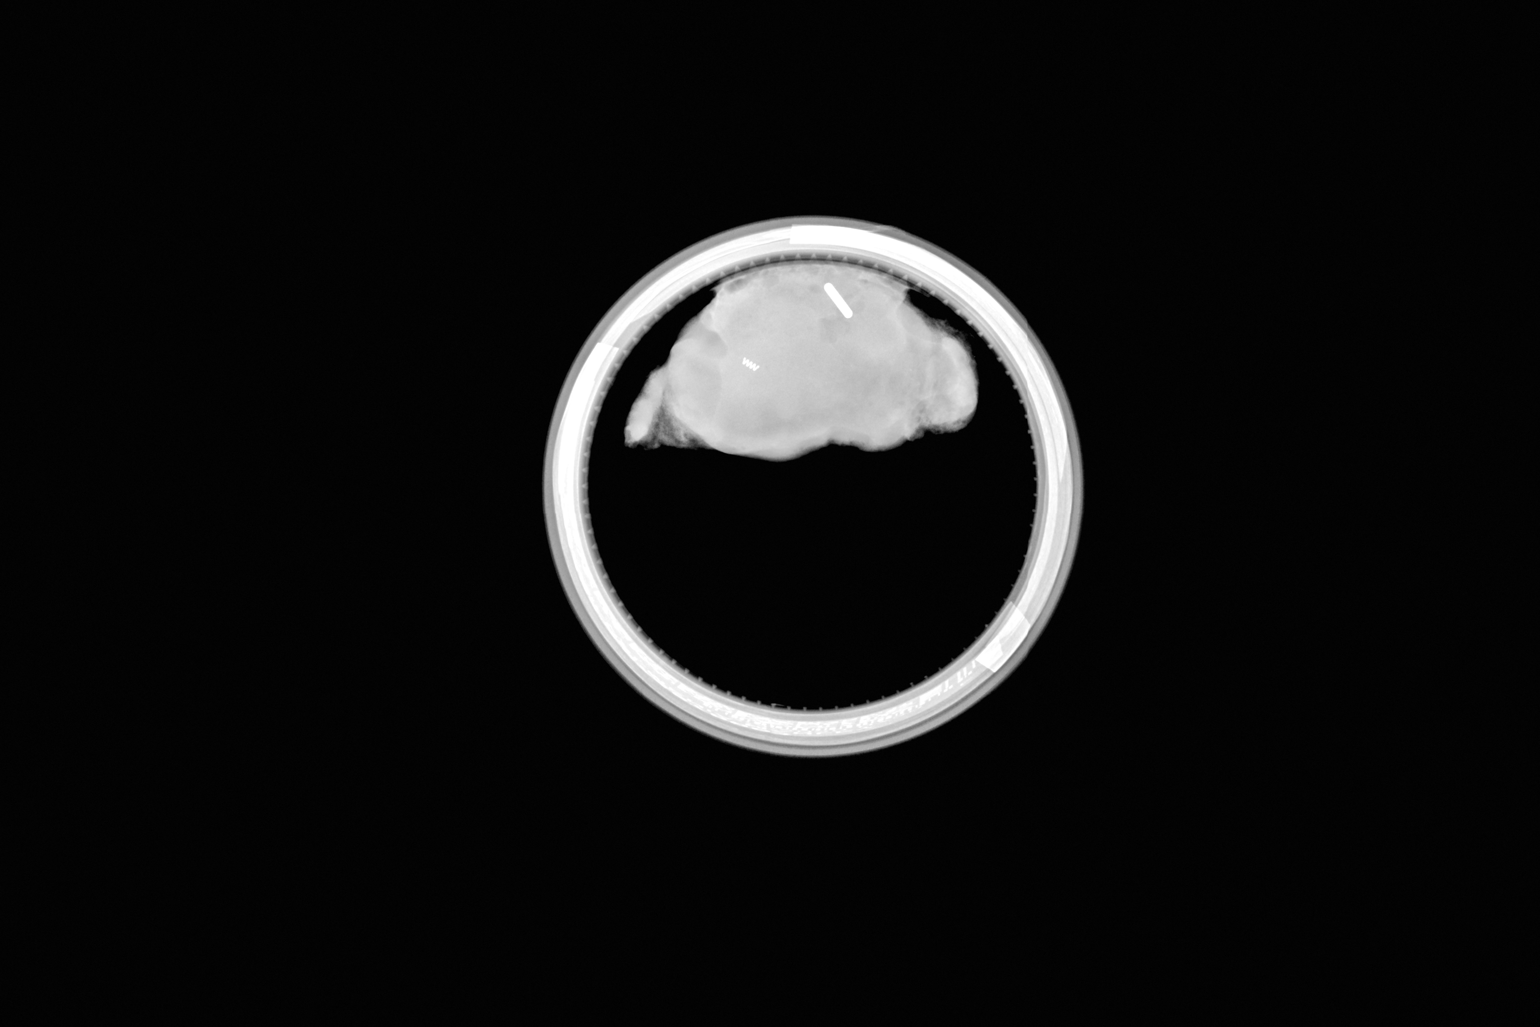

[1 of 1 positions shown; findings below may reference images not displayed]

FINDINGS: Status post excision of the RIGHT axillary lymph node. The
radioactive seed and biopsy marker clip are present and completely
intact.
IMPRESSION: Specimen radiograph of the right axilla status post excision of a
RIGHT axillary lymph node.

## 2020-02-19 ENCOUNTER — Other Ambulatory Visit: Payer: Self-pay | Admitting: Family Medicine

## 2020-02-19 DIAGNOSIS — I10 Essential (primary) hypertension: Secondary | ICD-10-CM

## 2020-02-21 ENCOUNTER — Telehealth: Payer: Self-pay | Admitting: Family Medicine

## 2020-02-21 ENCOUNTER — Other Ambulatory Visit: Payer: Self-pay | Admitting: Family Medicine

## 2020-02-21 DIAGNOSIS — I10 Essential (primary) hypertension: Secondary | ICD-10-CM

## 2020-02-21 MED ORDER — AMLODIPINE BESYLATE 5 MG PO TABS: 5.0000 mg | ORAL_TABLET | Freq: Two times a day (BID) | ORAL | 1 refills | Status: DC

## 2020-02-21 NOTE — Telephone Encounter (Signed)
Left a detailed message at the pts home number with the information below. 

## 2020-02-21 NOTE — Telephone Encounter (Signed)
Pt is calling in stating that she has called her pharmacy and they stated that her provider has not sent anything in for Rx amlodipine (NORVASC) 5 MG and would like to see if it can be resent.  Pt would like to have a call back.

## 2020-02-21 NOTE — Telephone Encounter (Signed)
Spoke with the pharmacist, Leann as the refill was sent on 11/10.  Leann stated they have two prescriptions with different sigs for Amlodipine.  Message sent to PCP to confirm correct dose and sent to the pharmacy.

## 2020-02-21 NOTE — Telephone Encounter (Signed)
Rx sent 

## 2020-03-03 ENCOUNTER — Ambulatory Visit (INDEPENDENT_AMBULATORY_CARE_PROVIDER_SITE_OTHER): Payer: Medicare Other

## 2020-03-03 ENCOUNTER — Other Ambulatory Visit: Payer: Self-pay

## 2020-03-03 DIAGNOSIS — Z0000 Encounter for general adult medical examination without abnormal findings: Secondary | ICD-10-CM

## 2020-03-03 NOTE — Patient Instructions (Signed)
[name] , Thank you for taking time to come for your Medicare Wellness Visit. I appreciate your ongoing commitment to your health goals. Please review the following plan we discussed and let me know if I can assist you in the future.   Screening recommendations/referrals: Colonoscopy: No longer required Mammogram: Done 06/14/19 Bone Density: Declined at this time Recommended yearly ophthalmology/optometry visit for glaucoma screening and checkup Recommended yearly dental visit for hygiene and checkup  Vaccinations: Influenza vaccine: Declined and discussed Pneumococcal vaccine: Declined and discussed Tdap vaccine: Declined and discussed Shingles vaccine: Shingrix discussed. Please contact your pharmacy for coverage information.    Covid-19:Completed 12/23/19 & 01/15/20  Advanced directives: Advance directive discussed with you today. Even though you declined this today please call our office should you change your mind and we can give you the proper paperwork for you to fill out.  Conditions/risks identified: Continue doing what she is doing  Next appointment: Follow up in one year for your annual wellness visit    Preventive Care 65 Years and Older, Female Preventive care refers to lifestyle choices and visits with your health care provider that can promote health and wellness. What does preventive care include?  A yearly physical exam. This is also called an annual well check.  Dental exams once or twice a year.  Routine eye exams. Ask your health care provider how often you should have your eyes checked.  Personal lifestyle choices, including:  Daily care of your teeth and gums.  Regular physical activity.  Eating a healthy diet.  Avoiding tobacco and drug use.  Limiting alcohol use.  Practicing safe sex.  Taking low-dose aspirin every day.  Taking vitamin and mineral supplements as recommended by your health care provider. What happens during an annual well  check? The services and screenings done by your health care provider during your annual well check will depend on your age, overall health, lifestyle risk factors, and family history of disease. Counseling  Your health care provider may ask you questions about your:  Alcohol use.  Tobacco use.  Drug use.  Emotional well-being.  Home and relationship well-being.  Sexual activity.  Eating habits.  History of falls.  Memory and ability to understand (cognition).  Work and work Statistician.  Reproductive health. Screening  You may have the following tests or measurements:  Height, weight, and BMI.  Blood pressure.  Lipid and cholesterol levels. These may be checked every 5 years, or more frequently if you are over 75 years old.  Skin check.  Lung cancer screening. You may have this screening every year starting at age 98 if you have a 30-pack-year history of smoking and currently smoke or have quit within the past 15 years.  Fecal occult blood test (FOBT) of the stool. You may have this test every year starting at age 38.  Flexible sigmoidoscopy or colonoscopy. You may have a sigmoidoscopy every 5 years or a colonoscopy every 10 years starting at age 75.  Hepatitis C blood test.  Hepatitis B blood test.  Sexually transmitted disease (STD) testing.  Diabetes screening. This is done by checking your blood sugar (glucose) after you have not eaten for a while (fasting). You may have this done every 1-3 years.  Bone density scan. This is done to screen for osteoporosis. You may have this done starting at age 46.  Mammogram. This may be done every 1-2 years. Talk to your health care provider about how often you should have regular mammograms. Talk with  your health care provider about your test results, treatment options, and if necessary, the need for more tests. Vaccines  Your health care provider may recommend certain vaccines, such as:  Influenza vaccine. This is  recommended every year.  Tetanus, diphtheria, and acellular pertussis (Tdap, Td) vaccine. You may need a Td booster every 10 years.  Zoster vaccine. You may need this after age 37.  Pneumococcal 13-valent conjugate (PCV13) vaccine. One dose is recommended after age 53.  Pneumococcal polysaccharide (PPSV23) vaccine. One dose is recommended after age 58. Talk to your health care provider about which screenings and vaccines you need and how often you need them. This information is not intended to replace advice given to you by your health care provider. Make sure you discuss any questions you have with your health care provider. Document Released: 04/24/2015 Document Revised: 12/16/2015 Document Reviewed: 01/27/2015 Elsevier Interactive Patient Education  2017 [employer] Prevention in the Home Falls can cause injuries. They can happen to people of all ages. There are many things you can do to make your home safe and to help prevent falls. What can I do on the outside of my home?  Regularly fix the edges of walkways and driveways and fix any cracks.  Remove anything that might make you trip as you walk through a door, such as a raised step or threshold.  Trim any bushes or trees on the path to your home.  Use bright outdoor lighting.  Clear any walking paths of anything that might make someone trip, such as rocks or tools.  Regularly check to see if handrails are loose or broken. Make sure that both sides of any steps have handrails.  Any raised decks and porches should have guardrails on the edges.  Have any leaves, snow, or ice cleared regularly.  Use sand or salt on walking paths during winter.  Clean up any spills in your garage right away. This includes oil or grease spills. What can I do in the bathroom?  Use night lights.  Install grab bars by the toilet and in the tub and shower. Do not use towel bars as grab bars.  Use non-skid mats or decals in the tub or  shower.  If you need to sit down in the shower, use a plastic, non-slip stool.  Keep the floor dry. Clean up any water that spills on the floor as soon as it happens.  Remove soap buildup in the tub or shower regularly.  Attach bath mats securely with double-sided non-slip rug tape.  Do not have throw rugs and other things on the floor that can make you trip. What can I do in the bedroom?  Use night lights.  Make sure that you have a light by your bed that is easy to reach.  Do not use any sheets or blankets that are too big for your bed. They should not hang down onto the floor.  Have a firm chair that has side arms. You can use this for support while you get dressed.  Do not have throw rugs and other things on the floor that can make you trip. What can I do in the kitchen?  Clean up any spills right away.  Avoid walking on wet floors.  Keep items that you use a lot in easy-to-reach places.  If you need to reach something above you, use a strong step stool that has a grab bar.  Keep electrical cords out of the way.  Do not  use floor polish or wax that makes floors slippery. If you must use wax, use non-skid floor wax.  Do not have throw rugs and other things on the floor that can make you trip. What can I do with my stairs?  Do not leave any items on the stairs.  Make sure that there are handrails on both sides of the stairs and use them. Fix handrails that are broken or loose. Make sure that handrails are as long as the stairways.  Check any carpeting to make sure that it is firmly attached to the stairs. Fix any carpet that is loose or worn.  Avoid having throw rugs at the top or bottom of the stairs. If you do have throw rugs, attach them to the floor with carpet tape.  Make sure that you have a light switch at the top of the stairs and the bottom of the stairs. If you do not have them, ask someone to add them for you. What else can I do to help prevent  falls?  Wear shoes that:  Do not have high heels.  Have rubber bottoms.  Are comfortable and fit you well.  Are closed at the toe. Do not wear sandals.  If you use a stepladder:  Make sure that it is fully opened. Do not climb a closed stepladder.  Make sure that both sides of the stepladder are locked into place.  Ask someone to hold it for you, if possible.  Clearly mark and make sure that you can see:  Any grab bars or handrails.  First and last steps.  Where the edge of each step is.  Use tools that help you move around (mobility aids) if they are needed. These include:  Canes.  Walkers.  Scooters.  Crutches.  Turn on the lights when you go into a dark area. Replace any light bulbs as soon as they burn out.  Set up your furniture so you have a clear path. Avoid moving your furniture around.  If any of your floors are uneven, fix them.  If there are any pets around you, be aware of where they are.  Review your medicines with your doctor. Some medicines can make you feel dizzy. This can increase your chance of falling. Ask your doctor what other things that you can do to help prevent falls. This information is not intended to replace advice given to you by your health care provider. Make sure you discuss any questions you have with your health care provider. Document Released: 01/22/2009 Document Revised: 09/03/2015 Document Reviewed: 05/02/2014 Elsevier Interactive Patient Education  2017 [employer].

## 2020-03-03 NOTE — Progress Notes (Signed)
Subjective:   Kristina Bridges is a 79 y.o. female who presents for Medicare Annual (Subsequent) preventive examination.  Review of Systems     Cardiac Risk Factors include: advanced age (>77men, >65 women);hypertension;dyslipidemia     Objective:    There were no vitals filed for this visit. There is no height or weight on file to calculate BMI.  Advanced Directives 03/03/2020 08/30/2019 05/03/2019 09/20/2018 06/13/2018 05/09/2018 05/02/2018  Does Patient Have a Medical Advance Directive? No No No No No No No  Would patient like information on creating a medical advance directive? No - Patient declined - - - - No - Patient declined No - Patient declined    Current Medications (verified) Outpatient Encounter Medications as of 03/03/2020  Medication Sig  . acetaminophen (TYLENOL) 500 MG tablet Take 500 mg by mouth as needed.  Marland Kitchen amLODipine (NORVASC) 5 MG tablet Take 1 tablet (5 mg total) by mouth in the morning and at bedtime.  Marland Kitchen losartan (COZAAR) 25 MG tablet Take 1 tablet (25 mg total) by mouth daily.  . Multiple Vitamin (MULTIVITAMIN) capsule Take 1 capsule by mouth daily.  . [DISCONTINUED] letrozole (FEMARA) 2.5 MG tablet Take 1 tablet (2.5 mg total) by mouth daily. (Patient not taking: Reported on 03/03/2020)   No facility-administered encounter medications on file as of 03/03/2020.    Allergies (verified) Anastrozole and Hydrochlorothiazide   History: Past Medical History:  Diagnosis Date  . Breast cancer ([place])    remote, tx with surgery and radiation - no recurrence  . Chicken pox   . Family history of cervical cancer   . Family history of leukemia   . Family history of prostate cancer   . Family history of throat cancer   . Hypertension   . Personal history of radiation therapy   . Seasonal allergies    Past Surgical History:  Procedure Laterality Date  . BREAST BIOPSY  1992  . BREAST LUMPECTOMY Left   . BREAST LUMPECTOMY WITH RADIOACTIVE SEED AND  SENTINEL LYMPH NODE BIOPSY Right 05/15/2018   Procedure: RIGHT BREAST LUMPECTOMY WITH RADIOACTIVE SEED AND RIGHT RADIOACTIVE SEED TARGETED LYMPH NODE BIOPSY AND RIGHT SENTINEL LYMPH NODE BIOPSY;  Surgeon: Stark Klein, MD;  Location: [place];  Service: General;  Laterality: Right;   Family History  Problem Relation Age of Onset  . Hypertension Other   . Hypertension Mother   . Cervical cancer Sister   . Cancer Brother        throat cancer   . Prostate cancer Brother   . Leukemia Daughter        CML  . Cervical cancer Daughter   . Prostate cancer Son   . Cervical cancer Sister    Social History   Socioeconomic History  . Marital status: Divorced    Spouse name: Not on file  . Number of children: Not on file  . Years of education: Not on file  . Highest education level: Not on file  Occupational History  . Occupation: retired  Tobacco Use  . Smoking status: Never Smoker  . Smokeless tobacco: Never Used  Substance and Sexual Activity  . Alcohol use: No  . Drug use: No  . Sexual activity: Not Currently  Other Topics Concern  . Not on file  Social History Narrative   Work or School: retired, used to work in [employer] Situation: lives alone - raised a grandson but he has moved out, he comes  to visit, has a lot of good friends - two daughters sylvia and zena with health issues      Spiritual Beliefs: Christian, haas good support      Lifestyle: exercises every day and eat healthy         Social Determinants of Health   Financial Resource Strain: Low Risk   . Difficulty of Paying Living Expenses: Not hard at all  Food Insecurity: No Food Insecurity  . Worried About Charity fundraiser in the Last Year: Never true  . Ran Out of Food in the Last Year: Never true  Transportation Needs: No Transportation Needs  . Lack of Transportation (Medical): No  . Lack of Transportation (Non-Medical): No  Physical Activity: Sufficiently Active  .  Days of Exercise per Week: 7 days  . Minutes of Exercise per Session: 60 min  Stress: No Stress Concern Present  . Feeling of Stress : Not at all  Social Connections: Moderately Isolated  . Frequency of Communication with Friends and Family: More than three times a week  . Frequency of Social Gatherings with Friends and Family: Three times a week  . Attends Religious Services: More than 4 times per year  . Active Member of Clubs or Organizations: No  . Attends Archivist Meetings: Never  . Marital Status: Divorced    Tobacco Counseling Counseling given: Not Answered   Clinical Intake:  Pre-visit preparation completed: Yes  Pain : No/denies pain     BMI - recorded: 21.37 Nutritional Status: BMI of 19-24  Normal Nutritional Risks: None Diabetes: No  How often do you need to have someone help you when you read instructions, pamphlets, or other written materials from your doctor or pharmacy?: 1 - Never  Diabetic?No  Interpreter Needed?: No  Information entered by :: Charlott Rakes, LPN   Activities of Daily Living In your present state of health, do you have any difficulty performing the following activities: 03/03/2020  Hearing? N  Vision? N  Difficulty concentrating or making decisions? N  Walking or climbing stairs? N  Dressing or bathing? N  Doing errands, shopping? N  Preparing Food and eating ? N  Using the Toilet? N  In the past six months, have you accidently leaked urine? N  Do you have problems with loss of bowel control? N  Managing your Medications? N  Managing your Finances? N  Housekeeping or managing your Housekeeping? N  Some recent data might be hidden    Patient Care Team: Caren Macadam, MD as PCP - General (Family Medicine) Stark Klein, MD as Consulting Physician (General Surgery) Truitt Merle, MD as Consulting Physician (Hematology) Gery Pray, MD as Consulting Physician (Radiation Oncology) Alla Feeling, NP as Nurse  Practitioner (Nurse Practitioner)  Indicate any recent Medical Services you may have received from other than [facility] providers in the past year (date may be approximate).     Assessment:   This is a routine wellness examination for Kristina Bridges.  Hearing/Vision screen  Hearing Screening   125Hz  250Hz  500Hz  1000Hz  2000Hz  3000Hz  4000Hz  6000Hz  8000Hz   Right ear:           Left ear:           Comments: Pt denies any hearing issues   Vision Screening Comments: Pt has not been in 7 years but states she will make appt soon  Dietary issues and exercise activities discussed: Current Exercise Habits: Home exercise routine, Type of exercise: walking;Other - see comments (all kind  of exercises)  Goals    . Patient Stated     Can maintain your current health  Continue to exercise  Continue to enjoy your life     . Patient Stated     Continue to do as good as she is      Depression Screen PHQ 2/9 Scores 03/03/2020 11/28/2018 05/23/2017 05/23/2017 03/17/2016 03/21/2014  PHQ - 2 Score 0 0 0 0 0 0    Fall Risk Fall Risk  03/03/2020 05/23/2017 05/23/2017 03/17/2016 03/21/2014  Falls in the past year? 0 No No No No  Number falls in past yr: 0 - - - -  Injury with Fall? 0 - - - -  Follow up Falls prevention discussed - - - -    Any stairs in or around the home? Yes  If so, are there any without handrails? No  Home free of loose throw rugs in walkways, pet beds, electrical cords, etc? Yes  Adequate lighting in your home to reduce risk of falls? Yes   ASSISTIVE DEVICES UTILIZED TO PREVENT FALLS:  Life alert? No  Use of a cane, walker or w/c? No    TIMED UP AND GO:  Was the test performed? No .      Cognitive Function: 6CIT Declined        Immunizations  There is no immunization history on file for this patient.  TDAP status: Due, Education has been provided regarding the importance of this vaccine. Advised may receive this vaccine at local pharmacy or Health Dept. Aware to  provide a copy of the vaccination record if obtained from local pharmacy or Health Dept. Verbalized acceptance and understanding. Flu Vaccine status: Declined, Education has been provided regarding the importance of this vaccine but patient still declined. Advised may receive this vaccine at local pharmacy or Health Dept. Aware to provide a copy of the vaccination record if obtained from local pharmacy or Health Dept. Verbalized acceptance and understanding. Pneumococcal vaccine status: Declined,  Education has been provided regarding the importance of this vaccine but patient still declined. Advised may receive this vaccine at local pharmacy or Health Dept. Aware to provide a copy of the vaccination record if obtained from local pharmacy or Health Dept. Verbalized acceptance and understanding.  Covid-19 vaccine status: Completed vaccines  Qualifies for Shingles Vaccine? Yes   Zostavax completed No   Shingrix Completed?: No.    Education has been provided regarding the importance of this vaccine. Patient has been advised to call insurance company to determine out of pocket expense if they have not yet received this vaccine. Advised may also receive vaccine at local pharmacy or Health Dept. Verbalized acceptance and understanding.  Screening Tests Health Maintenance  Topic Date Due  . Hepatitis C Screening  Never done  . COVID-19 Vaccine (1) Never done  . DEXA SCAN  12/22/2020 (Originally 04/25/2006)  . TETANUS/TDAP  09/17/2024 (Originally 09/10/2014)  . PNA vac Low Risk Adult (1 of 2 - PCV13) 09/17/2024 (Originally 04/25/2006)  . INFLUENZA VACCINE  03/11/2025 (Originally 11/10/2019)  . MAMMOGRAM  06/13/2020    Health Maintenance  Health Maintenance Due  Topic Date Due  . Hepatitis C Screening  Never done  . COVID-19 Vaccine (1) Never done    Colorectal cancer screening: No longer required.  Mammogram status: Completed 06/14/19. Repeat every year Bone density postponed 12/22/20   Additional  Screening:  Hepatitis C Screening: does qualify  Vision Screening: Recommended annual ophthalmology exams for early detection of glaucoma and other  disorders of the eye. Is the patient up to date with their annual eye exam?  No  Who is the provider or what is the name of the office in which the patient attends annual eye exams? Pt states she will make appt    Dental Screening: Recommended annual dental exams for proper oral hygiene  Community Resource Referral / Chronic Care Management: CRR required this visit?  No   CCM required this visit?  No      Plan:     I have personally reviewed and noted the following in the patient's chart:   . Medical and social history . Use of alcohol, tobacco or illicit drugs  . Current medications and supplements . Functional ability and status . Nutritional status . Physical activity . Advanced directives . List of other physicians . Hospitalizations, surgeries, and ER visits in previous 12 months . Vitals . Screenings to include cognitive, depression, and falls . Referrals and appointments  In addition, I have reviewed and discussed with patient certain preventive protocols, quality metrics, and best practice recommendations. A written personalized care plan for preventive services as well as general preventive health recommendations were provided to patient.     Willette Brace, LPN   03/50/0938   Nurse Notes: None

## 2020-03-20 ENCOUNTER — Ambulatory Visit (INDEPENDENT_AMBULATORY_CARE_PROVIDER_SITE_OTHER): Payer: Medicare Other | Admitting: Family Medicine

## 2020-03-20 ENCOUNTER — Encounter: Payer: Self-pay | Admitting: Family Medicine

## 2020-03-20 ENCOUNTER — Other Ambulatory Visit: Payer: Self-pay

## 2020-03-20 VITALS — BP 144/80 | HR 77 | Ht 65.0 in | Wt 127.1 lb

## 2020-03-20 DIAGNOSIS — I10 Essential (primary) hypertension: Secondary | ICD-10-CM | POA: Diagnosis not present

## 2020-03-20 NOTE — Progress Notes (Signed)
Kristina Bridges DOB: [birth year] Encounter date: 03/20/2020  This is a 79 y.o. female who presents with Chief Complaint  Patient presents with  . Follow-up    History of present illness: Last visit was 3 months ago.  This is a follow-up for blood pressure recheck.  Hypertension: At last visit we continued amlodipine 5 mg twice daily but increase losartan to 25 mg in the evening. Blood pressures typically in lower 130's/75-80 at home. Did bring in readings with her today.  Had home visit and they encouraged her to have eyes checked. Has been 7 years since last eye exam. She has name of doc she plans to follow up with (doesn't remember name now). Her bp at home was 130/78 on that home visit. She has not had any numbers over 140 at home. The home nurse used their own cuff for the visit.   Kids are doing well right now. She is feeling well and doing a lot for others. Reads daily prayer.   Doesn't want bone density study. We discussed pros of this in terms of preventative care.   Did complete COVID vaccination; not sure of dates.    Allergies  Allergen Reactions  . Anastrozole     Skin rashes   . Hydrochlorothiazide     Rash; mild   Current Meds  Medication Sig  . acetaminophen (TYLENOL) 500 MG tablet Take 500 mg by mouth as needed.  Marland Kitchen amLODipine (NORVASC) 5 MG tablet Take 1 tablet (5 mg total) by mouth in the morning and at bedtime.  Marland Kitchen losartan (COZAAR) 25 MG tablet Take 1 tablet (25 mg total) by mouth daily.  . Multiple Vitamin (MULTIVITAMIN) capsule Take 1 capsule by mouth daily.    Review of Systems  Constitutional: Negative for chills, fatigue and fever.  Respiratory: Negative for cough, chest tightness, shortness of breath and wheezing.   Cardiovascular: Negative for chest pain, palpitations and leg swelling.    Objective:  BP (!) 144/80   Pulse 77   Ht 5\' 5"  (1.651 m)   Wt 127 lb 1.6 oz (57.7 kg)   BMI 21.15 kg/m   Weight: 127 lb 1.6 oz (57.7 kg)    BP Readings from Last 3 Encounters:  03/20/20 (!) 144/80  12/23/19 (!) 142/90  08/30/19 (!) 148/77   Wt Readings from Last 3 Encounters:  03/20/20 127 lb 1.6 oz (57.7 kg)  12/23/19 128 lb 6.4 oz (58.2 kg)  08/30/19 123 lb 11.2 oz (56.1 kg)    Physical Exam Constitutional:      General: She is not in acute distress.    Appearance: She is well-developed.  Cardiovascular:     Rate and Rhythm: Normal rate and regular rhythm.     Heart sounds: Normal heart sounds. No murmur heard. No friction rub.  Pulmonary:     Effort: Pulmonary effort is normal. No respiratory distress.     Breath sounds: Normal breath sounds. No wheezing or rales.  Musculoskeletal:     Right lower leg: No edema.     Left lower leg: No edema.     Comments: Kyphosis and some thoracic scoliosis noted.   Neurological:     Mental Status: She is alert and oriented to person, place, and time.  Psychiatric:        Behavior: Behavior normal.     Assessment/Plan  1. Essential hypertension She really prefers not to increase med doses. We will continue current medications amlodipine 5mg  and losartan 25mg  for now. We discussed  that if systolic pressure 751 we will increase medication.   Declines bone density.      Return in about 6 months (around 09/18/2020) for physical exam.    Micheline Rough, MD

## 2020-03-20 NOTE — Patient Instructions (Addendum)
Continue to monitor pressures at home. You don't have to check more than once daily. If you are seeing numbers in the 140's then let me know. Otherwise we will keep things the same.

## 2020-04-29 NOTE — Progress Notes (Incomplete)
[place]   [phone] [phone]   Clinic Follow up Note   Patient Care Team: Caren Macadam, MD as PCP - General (Family Medicine) Stark Klein, MD as Consulting Physician (General Surgery) Truitt Merle, MD as Consulting Physician (Hematology) Gery Pray, MD as Consulting Physician (Radiation Oncology) Alla Feeling, NP as Nurse Practitioner (Nurse Practitioner)  Date of Service:  04/29/2020  CHIEF COMPLAINT: F/u of right breast cancer  SUMMARY OF ONCOLOGIC HISTORY: Oncology History Overview Note  Cancer Staging Malignant neoplasm of upper-outer quadrant of right breast in female, estrogen receptor positive ([place]) Staging form: Breast, AJCC 8th Edition - Clinical stage from 04/20/2018: Stage IIA (cT2, cN1, cM0, G1, ER+, PR+, HER2-) - Unsigned - Pathologic stage from 05/15/2018: Stage IB (pT2, pN1a, cM0, G2, ER+, PR+, HER2-) - Signed by Truitt Merle, MD on 05/25/2018     Malignant neoplasm of upper-outer quadrant of right breast in female, estrogen receptor positive ([place])  04/18/2018 Mammogram   Diagnostic [place] 04/18/18  IMPRESSION: Suspicious mass measuring 2.4 x 1.3 centimeters in the retroareolar region of the RIGHT breast  Enlarged RIGHT axillary lymph node.   04/20/2018 Initial Biopsy   Diagnosis 04/20/18 1. Breast, right, needle core biopsy, 11 o'clock - INVASIVE DUCTAL CARCINOMA. - SEE COMMENT. 2. Lymph node, needle/core biopsy, right axilla - METASTATIC CARCINOMA IN 1 OF 1 LYMPH NODE (1/1). - SEE COMMENT. Microscopic Comment 1. The carcinoma appears grade I. A breast prognostic profile will be performed and the results reported separately. 2. The carcinoma in part 2 is somewhat morphologically dissimilar from that seen in part 1. A breast prognostic profile will also be performed on part 2 and the results reported separately. The results were called to [facility] on 04/23/2018. (JBK:ecj 04/23/2018)    04/20/2018 Receptors her2   IMMUNOHISTOCHEMICAL AND MORPHOMETRIC ANALYSIS PERFORMED MANUALLY The tumor cells are NEGATIVE for Her2 (1+). Estrogen Receptor: 100%, POSITIVE, STRONG STAINING INTENSITY Progesterone Receptor: 50%, POSITIVE, STRONG STAINING INTENSITY Proliferation Marker Ki67: 40%   04/27/2018 Initial Diagnosis   Malignant neoplasm of upper-outer quadrant of right breast in female, estrogen receptor positive ([place])   05/15/2018 Surgery   RIGHT BREAST LUMPECTOMY WITH RADIOACTIVE SEED AND RIGHT RADIOACTIVE SEED TARGETED LYMPH NODE BIOPSY AND RIGHT SENTINEL LYMPH NODE BIOPSY by Dr. Barry Dienes  05/15/18    05/15/2018 Pathology Results   Diagnosis 1. Breast, lumpectomy, Right w/seed - INVASIVE DUCTAL CARCINOMA, GRADE II, 3.7 CM. - DUCTAL CARCINOMA IN SITU, INTERMEDIATE NUCLEAR GRADE. - LOBULAR CARCINOMA IN SITU. - CARCINOMA IS 1 MM FROM THE POSTERIOR MARGIN AND 4 MM FROM THE SUPERIOR MARGIN. SEE NOTE - CARCINOMA FOCALLY INVOLVES SUBAREOLAR DERMIS BUT NIPPLE IS NOT INVOLVED. - DUCTAL CARCINOMA IN SITU IS 0.5 CM FROM THE MEDIAL MARGIN - NEGATIVE FOR LYMPHOVASCULAR OR PERINEURAL INVASION. - BIOPSY SITE CHANGES. - SEE ONCOLOGY TABLE 2. Lymph node, sentinel, biopsy, Right axillary 1&2 with seed - METASTATIC DUCTAL CARCINOMA TO A LYMPH NODE (1/1). - METASTATIC FOCUS MEASURES 2.3 CM AND SHOWS EXTRANODAL EXTENSION. 3. Lymph node, sentinel, biopsy, Right - LYMPH NODE, NEGATIVE FOR CARCINOMA (0/1). - IMMUNOSTAIN FOR PANKERATIN (AE1/AE3) IS NEGATIVE 4. Lymph node, sentinel, biopsy, Right axillary #3 - LYMPH NODE, NEGATIVE FOR CARCINOMA (0/1). - IMMUNOSTAIN FOR PANKERATIN (AE1/AE3) IS NEGATIVE    05/15/2018 Cancer Staging   Staging form: Breast, AJCC 8th Edition - Pathologic stage from 05/15/2018: Stage IB (pT2, pN1a, cM0, G2, ER+, PR+, HER2-) - Signed by Truitt Merle, MD on 05/25/2018   07/03/2018 -  08/20/2018 Radiation Therapy   adjuvant radiation 07/03/18-08/20/18 by Dr. Sondra Come   11/08/2018 -   Anti-estrogen oral therapy   Adjuvant Anastrozole 91m daily starting 10/24/18. Due to reaction with skin rash, This was stopped 1 week later. She declined exemestane due to high copay.  She was switched to Letrozole in 11/2018 but she never took it. She started in 04/2019.       CURRENT THERAPY:  AdjuvantAnastrozole145mdailystarting 10/24/18.Due to reaction with skin rash, This was stopped 1 week later. She was switched to Letrozole in 8/2020but she never took it. She started in 04/2019.  INTERVAL HISTORY: *** [name] here for a follow up of right breast cancer. She was last seen by me 8 months ago. She presents to the clinic alone.    REVIEW OF SYSTEMS:  *** Constitutional: Denies fevers, chills or abnormal weight loss Eyes: Denies blurriness of vision Ears, nose, mouth, throat, and face: Denies mucositis or sore throat Respiratory: Denies cough, dyspnea or wheezes Cardiovascular: Denies palpitation, chest discomfort or lower extremity swelling Gastrointestinal:  Denies nausea, heartburn or change in bowel habits Skin: Denies abnormal skin rashes Lymphatics: Denies new lymphadenopathy or easy bruising Neurological:Denies numbness, tingling or new weaknesses Behavioral/Psych: Mood is stable, no new changes  All other systems were reviewed with the patient and are negative.  MEDICAL HISTORY:  Past Medical History:  Diagnosis Date  . [place], tx with surgery and radiation - no recurrence  . Chicken pox   . Family history of cervical cancer   . Family history of leukemia   . Family history of prostate cancer   . Family history of throat cancer   . Hypertension   . Personal history of radiation therapy   . Seasonal allergies     SURGICAL HISTORY: Past Surgical History:  Procedure Laterality Date  . BREAST BIOPSY  1992  . BREAST LUMPECTOMY Left   . BREAST LUMPECTOMY WITH RADIOACTIVE SEED AND SENTINEL LYMPH NODE BIOPSY Right  05/15/2018   Procedure: RIGHT BREAST LUMPECTOMY WITH RADIOACTIVE SEED AND RIGHT RADIOACTIVE SEED TARGETED LYMPH NODE BIOPSY AND RIGHT SENTINEL LYMPH NODE BIOPSY;  Surgeon: ByStark KleinMD;  [facility]: General;  Laterality: Right;    I have reviewed the social history and family history with the patient and they are unchanged from previous note.  ALLERGIES:  is allergic to anastrozole and hydrochlorothiazide.  MEDICATIONS:  Current Outpatient Medications  Medication Sig Dispense Refill  . acetaminophen (TYLENOL) 500 MG tablet Take 500 mg by mouth as needed.    . Marland KitchenmLODipine (NORVASC) 5 MG tablet Take 1 tablet (5 mg total) by mouth in the morning and at bedtime. 180 tablet 1  . losartan (COZAAR) 25 MG tablet Take 1 tablet (25 mg total) by mouth daily. 90 tablet 1  . Multiple Vitamin (MULTIVITAMIN) capsule Take 1 capsule by mouth daily.     No current facility-administered medications for this visit.    PHYSICAL EXAMINATION: ECOG PERFORMANCE STATUS: {CHL ONC ECOG PS:[id number]}  There were no vitals filed for this visit. There were no vitals filed for this visit. *** GENERAL:alert, no distress and comfortable SKIN: skin color, texture, turgor are normal, no rashes or significant lesions EYES: normal, Conjunctiva are pink and non-injected, sclera clear {OROPHARYNX:no exudate, no erythema and lips, buccal mucosa, and tongue normal}  NECK: supple, thyroid normal size, non-tender, without nodularity LYMPH:  no palpable lymphadenopathy in the cervical, axillary {or inguinal} LUNGS: clear  to auscultation and percussion with normal breathing effort HEART: regular rate & rhythm and no murmurs and no lower extremity edema ABDOMEN:abdomen soft, non-tender and normal bowel sounds Musculoskeletal:no cyanosis of digits and no clubbing  NEURO: alert & oriented x 3 with fluent speech, no focal motor/sensory deficits  LABORATORY DATA:  I have reviewed the data as  listed CBC Latest Ref Rng & Units 12/23/2019 05/03/2019 10/24/2018  WBC 3.8 - 10.8 Thousand/uL 6.8 6.8 6.0  Hemoglobin 11.7 - 15.5 g/dL 13.2 14.0 12.6  Hematocrit 35.0 - 45.0 % 39.9 42.6 39.6  Platelets 140 - 400 Thousand/uL 262 222 214     CMP Latest Ref Rng & Units 12/23/2019 05/03/2019 11/12/2018  Glucose 65 - 99 mg/dL 84 86 78  BUN 7 - 25 mg/dL '20 17 14  ' Creatinine 0.60 - 0.93 mg/dL 0.88 0.82 0.68  Sodium 135 - 146 mmol/L 137 140 137  Potassium 3.5 - 5.3 mmol/L 4.4 4.7 4.0  Chloride 98 - 110 mmol/L 101 103 101  CO2 20 - 32 mmol/L '29 28 26  ' Calcium 8.6 - 10.4 mg/dL 9.4 9.4 9.9  Total Protein 6.1 - 8.1 g/dL 6.8 7.2 -  Total Bilirubin 0.2 - 1.2 mg/dL 0.6 0.8 -  Alkaline Phos 38 - 126 U/L - 103 -  AST 10 - 35 U/L 17 17 -  ALT 6 - 29 U/L 10 9 -      RADIOGRAPHIC STUDIES: I have personally reviewed the radiological images as listed and agreed with the findings in the report. No results found.   ASSESSMENT & PLAN:  Kristina Bridges is a 80 y.o. female with    1.Malignant neoplasm of upper-outer quadrant of right breast, StageIB, pT2N1aM0, ER+/PR/HER2-, Kristina Bridges -She was diagnosed in 04/2018. She is s/prightbreast lumpectomy with SLNB and adjuvant radiation. She tolerated very well. Pt declined mammaprint test for risk stratification, she is not interested in chemo  -She started antiestrogen therapy with Anastrozole in 10/2018. Due to reaction with skin rash, This was stopped 1 week later. She started Letrozole in 04/2019.  -She stopped Letrozole after a few days due to hypertension. She would like to better control her HTN and has been following with her primary care physician for medication adjustment. She Is willing to restart when her HTN is better controlled. I encouraged her to restart Letrozole after her HTN is doing well for 1 month, she is agreeable.  -From a breast cancer standpoint She is clinically doing well. Lab reviewed, her CBC and CMP are within normal  limits. Her physical exam stable. She plans to have f/u left mammogram for her left breast calcifications.  -Continue Surveillance -I will call her in 4 months. OV in 04/2020 as previously scheduled.    2.H/oLeftbreast cancer -She was diagnosedin 1992. Treated with Leftbreast lumpectomyand radiation in [place].  -Given this is her second breast cancer and strong family history of cancer she is eligible for genetic cancer, but she declined.  3. HTN -ContinueAmlodipineand follow-up with PCP -Not well controlled. I encouraged her to monitor her BP at home and f/u with PCP for possible medication adjustment.   4. Bone Health  -She has not had a bone density scan before as she declined repeating.  -I again discussed AI can weaken her bone, so I recommendedshe get a baseline DEXA to monitor. She declined.  -I encouraged her to take Calcium and Vit D.    PLAN:  -Left mammogram 12/17/19 -Virtual visit in 4 months  -Lab and F/u in  04/2020 as scheduled  -pt is willing to restart Letrozole when her blood pressure is better controlled.    No problem-specific Assessment & Plan notes found for this encounter.   No orders of the defined types were placed in this encounter.  All questions were answered. The patient knows to call the clinic with any problems, questions or concerns. No barriers to learning was detected. The total time spent in the appointment was {CHL ONC TIME VISIT - PNSQZ:[id number]}.     Joslyn Devon 04/29/2020   Oneal Deputy, am acting as scribe for Truitt Merle, MD.   {Add scribe attestation statement}

## 2020-04-30 ENCOUNTER — Other Ambulatory Visit: Payer: Self-pay

## 2020-04-30 DIAGNOSIS — C50411 Malignant neoplasm of upper-outer quadrant of right female breast: Secondary | ICD-10-CM

## 2020-05-01 ENCOUNTER — Inpatient Hospital Stay: Payer: Medicare Other | Attending: Hematology | Admitting: Hematology

## 2020-05-01 ENCOUNTER — Inpatient Hospital Stay: Payer: Medicare Other

## 2020-08-29 ENCOUNTER — Other Ambulatory Visit: Payer: Self-pay | Admitting: Family Medicine

## 2020-10-14 ENCOUNTER — Encounter: Payer: Self-pay | Admitting: Family Medicine

## 2020-10-14 ENCOUNTER — Other Ambulatory Visit: Payer: Self-pay

## 2020-10-14 ENCOUNTER — Ambulatory Visit (INDEPENDENT_AMBULATORY_CARE_PROVIDER_SITE_OTHER): Payer: Medicare Other | Admitting: Family Medicine

## 2020-10-14 VITALS — BP 140/70 | HR 84 | Temp 98.4°F | Ht 64.0 in | Wt 134.1 lb

## 2020-10-14 DIAGNOSIS — Z1159 Encounter for screening for other viral diseases: Secondary | ICD-10-CM

## 2020-10-14 DIAGNOSIS — I10 Essential (primary) hypertension: Secondary | ICD-10-CM | POA: Diagnosis not present

## 2020-10-14 DIAGNOSIS — E785 Hyperlipidemia, unspecified: Secondary | ICD-10-CM

## 2020-10-14 NOTE — Patient Instructions (Signed)
Check blood pressure 1-2 times/week. Let me know if numbers are regularly over 135/85 when you check at home.

## 2020-10-14 NOTE — Progress Notes (Signed)
Kristina Bridges DOB: 08-17-1940 Encounter date: 10/14/2020  This is a 80 y.o. female who presents with Chief Complaint  Patient presents with   Annual Exam    History of present illness:  HTN: not checking bp at home. Feels well overall. Energy level is great. Was working on gaining weight; happy with current weight. Just needed clothes to fit again.   She does exercises every day for hours. She has a very strict schedule. Back is feeling better. Still helping others at church.   She is not interested in mammogram, other immunizations, colon cancer screening. She declines breast exam today. She states she feels healthy and will let me know if something changes.   Allergies  Allergen Reactions   Anastrozole     Skin rashes    Hydrochlorothiazide     Rash; mild   Current Meds  Medication Sig   acetaminophen (TYLENOL) 500 MG tablet Take 500 mg by mouth as needed.   amLODipine (NORVASC) 5 MG tablet Take 1 tablet (5 mg total) by mouth in the morning and at bedtime.   losartan (COZAAR) 25 MG tablet TAKE 1 TABLET(25 MG) BY MOUTH DAILY   Multiple Vitamin (MULTIVITAMIN) capsule Take 1 capsule by mouth daily.    Review of Systems  Constitutional:  Negative for activity change, appetite change, chills, fatigue, fever and unexpected weight change.  HENT:  Negative for congestion, ear pain, hearing loss, sinus pressure, sinus pain, sore throat and trouble swallowing.   Eyes:  Negative for pain and visual disturbance.  Respiratory:  Negative for cough, chest tightness, shortness of breath and wheezing.   Cardiovascular:  Negative for chest pain, palpitations and leg swelling.  Gastrointestinal:  Negative for abdominal pain, blood in stool, constipation, diarrhea, nausea and vomiting.  Genitourinary:  Negative for difficulty urinating and menstrual problem.  Musculoskeletal:  Negative for arthralgias and back pain.  Skin:  Negative for rash.  Neurological:  Negative for  dizziness, weakness, numbness and headaches.  Hematological:  Negative for adenopathy. Does not bruise/bleed easily.  Psychiatric/Behavioral:  Negative for sleep disturbance and suicidal ideas. The patient is not nervous/anxious.    Objective:  BP (!) 144/84 (BP Location: Left Arm, Patient Position: Sitting, Cuff Size: Normal)   Pulse 84   Temp 98.4 F (36.9 C) (Rectal)   Ht 5\' 4"  (1.626 m)   Wt 134 lb 1.6 oz (60.8 kg)   SpO2 99%   BMI 23.02 kg/m   Weight: 134 lb 1.6 oz (60.8 kg)   BP Readings from Last 3 Encounters:  10/14/20 (!) 144/84  03/20/20 (!) 144/80  12/23/19 (!) 142/90   Wt Readings from Last 3 Encounters:  10/14/20 134 lb 1.6 oz (60.8 kg)  03/20/20 127 lb 1.6 oz (57.7 kg)  12/23/19 128 lb 6.4 oz (58.2 kg)    Physical Exam Constitutional:      General: She is not in acute distress.    Appearance: She is well-developed.  HENT:     Head: Normocephalic and atraumatic.     Right Ear: External ear normal.     Left Ear: External ear normal.     Mouth/Throat:     Comments: Declines oral exam today, but states no issues with chewing, swallowing Eyes:     Conjunctiva/sclera: Conjunctivae normal.     Pupils: Pupils are equal, round, and reactive to light.  Neck:     Thyroid: No thyromegaly.  Cardiovascular:     Rate and Rhythm: Normal rate and regular rhythm.  Heart sounds: Normal heart sounds. No murmur heard.   No friction rub. No gallop.  Pulmonary:     Effort: Pulmonary effort is normal.     Breath sounds: Normal breath sounds.  Abdominal:     General: Bowel sounds are normal. There is no distension.     Palpations: Abdomen is soft. There is no mass.     Tenderness: There is no abdominal tenderness. There is no guarding.     Hernia: No hernia is present.  Musculoskeletal:        General: No tenderness or deformity. Normal range of motion.     Cervical back: Normal range of motion and neck supple.  Lymphadenopathy:     Cervical: No cervical adenopathy.   Skin:    General: Skin is warm and dry.     Findings: No rash.  Neurological:     Mental Status: She is alert and oriented to person, place, and time.     Deep Tendon Reflexes: Reflexes normal.     Reflex Scores:      Tricep reflexes are 2+ on the right side and 2+ on the left side.      Bicep reflexes are 2+ on the right side and 2+ on the left side.      Brachioradialis reflexes are 2+ on the right side and 2+ on the left side.      Patellar reflexes are 2+ on the right side and 2+ on the left side. Psychiatric:        Speech: Speech normal.        Behavior: Behavior normal.        Thought Content: Thought content normal.    Assessment/Plan 1. Essential hypertension Pressures at home are lower than in office. I do not want to change meds at risk of hypotension at home. Encouraged her to keep up with all her exercise. She is working hard to be healthy, and keep pressure maintained. - CBC with Differential/Platelet; Future - Comprehensive metabolic panel; Future  2. Mild hyperlipidemia Diet controlled. We will recheck with bloodwork.  - Lipid panel; Future  3. Need for hepatitis C screening test - Hepatitis C antibody; Future  Return in about 6 months (around 04/16/2021) for Chronic condition visit; bloodwork before end of year please.     Micheline Rough, MD

## 2020-10-26 ENCOUNTER — Other Ambulatory Visit: Payer: Self-pay | Admitting: Family Medicine

## 2020-10-26 DIAGNOSIS — I10 Essential (primary) hypertension: Secondary | ICD-10-CM

## 2021-01-08 ENCOUNTER — Telehealth: Payer: Self-pay | Admitting: Family Medicine

## 2021-01-08 NOTE — Telephone Encounter (Signed)
Left message for patient to call back and schedule Medicare Annual Wellness Visit (AWV) either virtually or in office. Left  my Kristina Bridges number [phone]   Last AWV 03/03/21  please schedule at anytime with [facility] Nurse Health Advisor 1 or 2   This should be a 45 minute visit.   [facility] can do AWVS calendar year

## 2021-01-23 IMAGING — US ULTRASOUND NEEDLE LOCALIZATION*R*
1 series · 5 of 5 positions shown · non-contrast
Comparison: Previous exam(s).

CLINICAL DATA: Radioactive seed localization of a biopsy-proven
malignancy in the 11-12 o'clock retroareolar right breast was
requested.

EXAM:
ULTRASOUND GUIDED RADIOACTIVE SEED LOCALIZATION OF THE RIGHT BREAST

[Series 1: ultrasound needle localization*right* · 0.07mm/px · 5 of 5 slices shown]
[im 1/5]
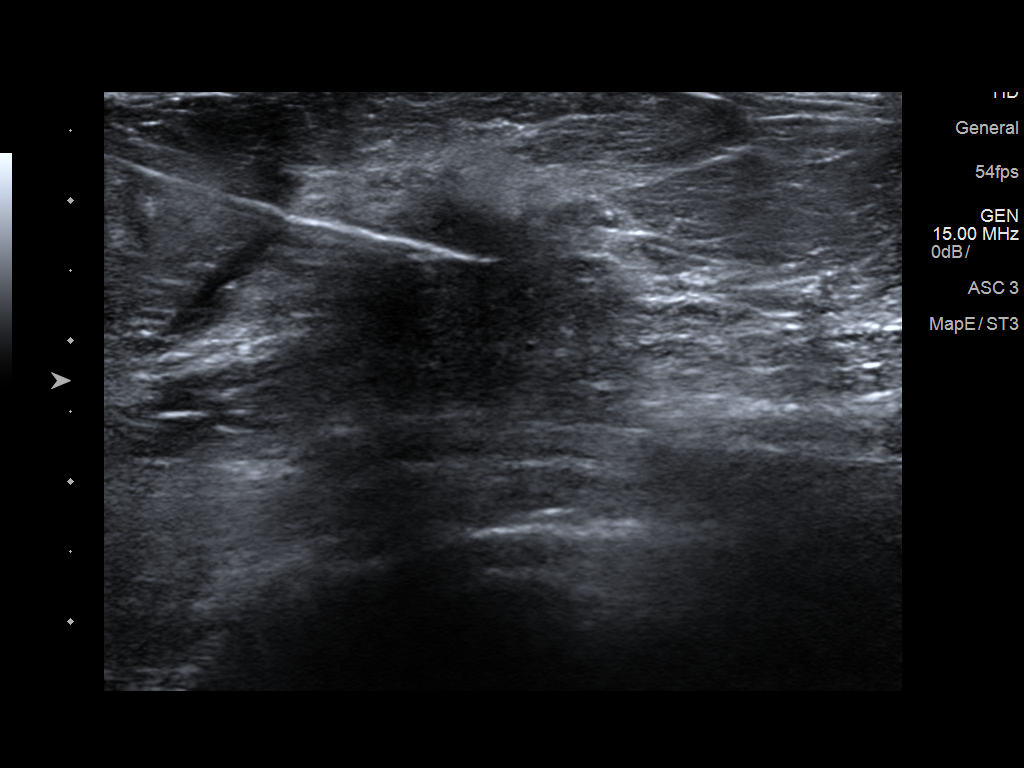
[im 2/5]
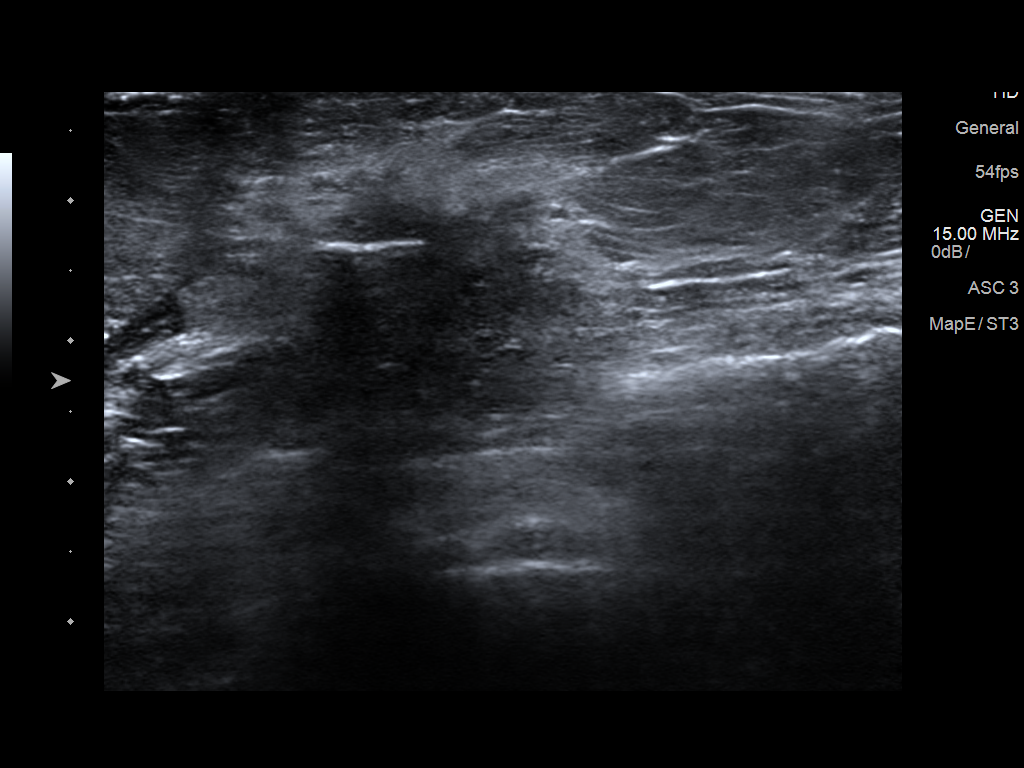
[im 3/5]
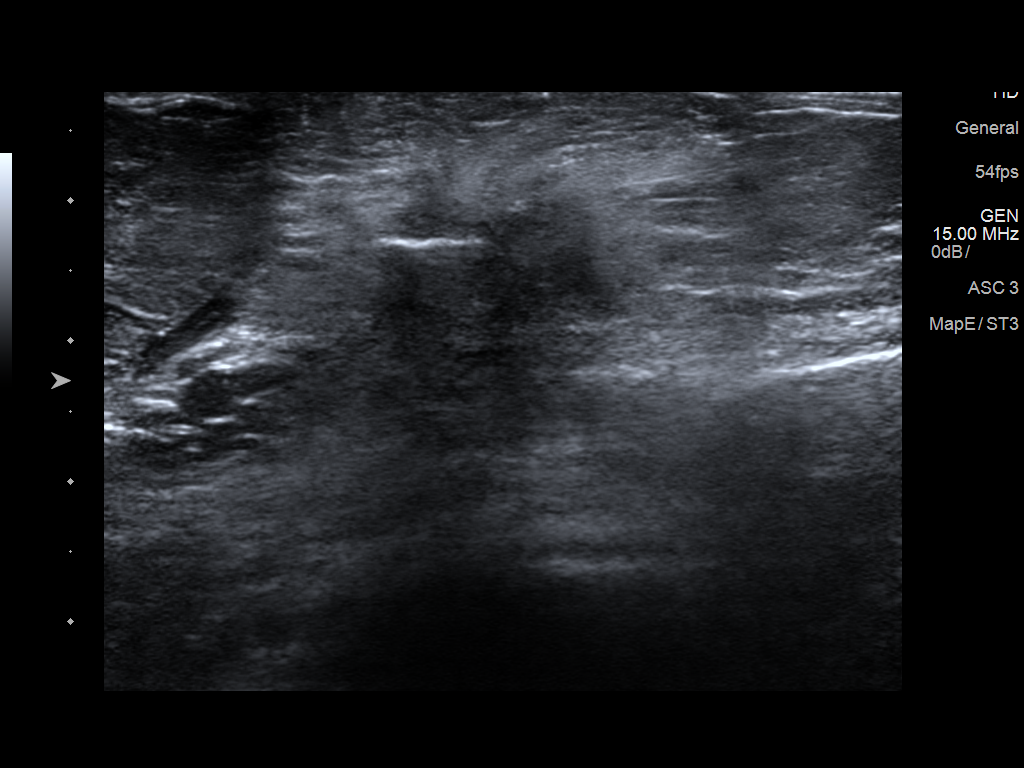
[im 4/5]
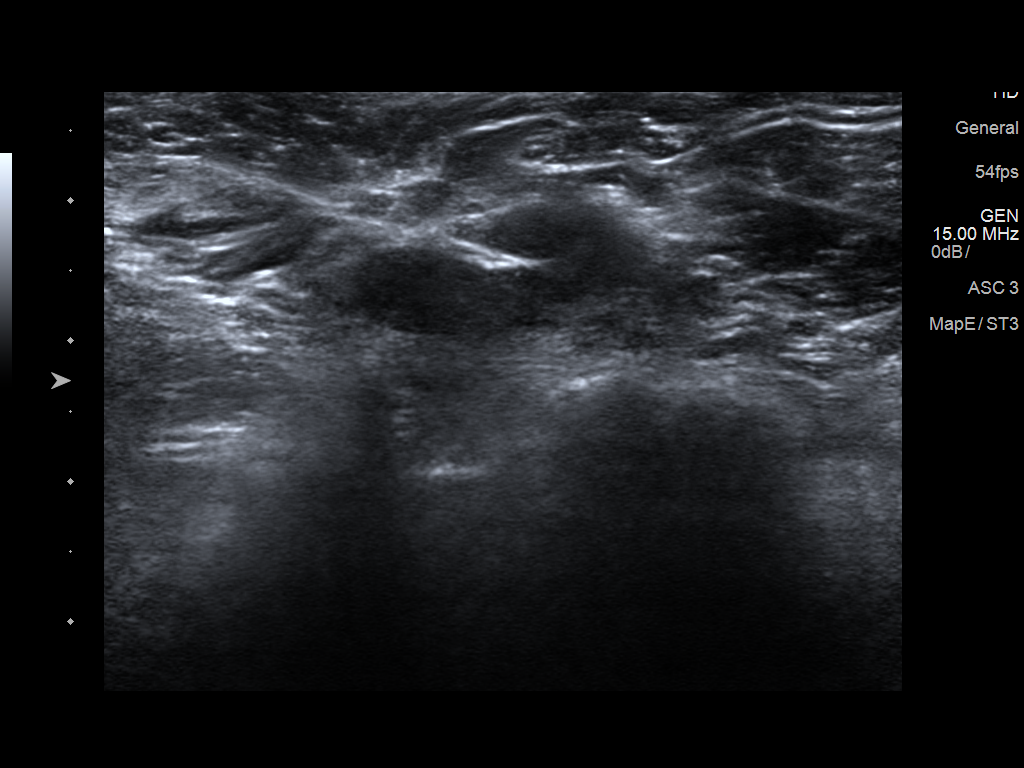
[im 5/5]
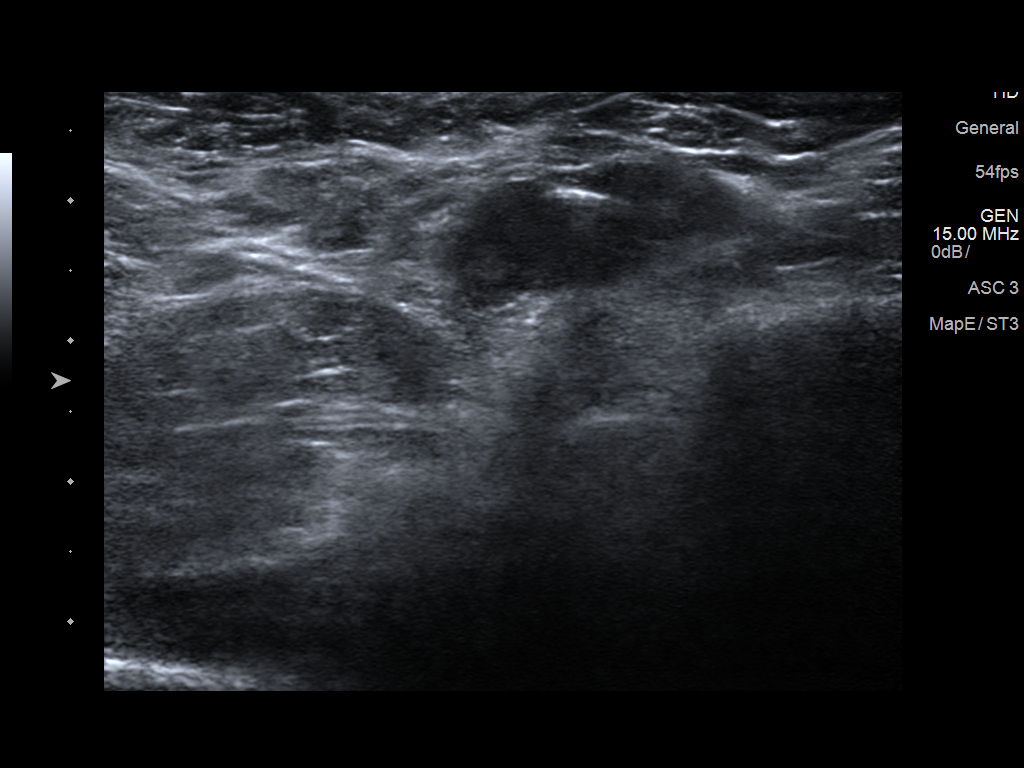

[5 of 5 positions shown; findings below may reference images not displayed]

FINDINGS: Patient presents for radioactive seed localization prior to
lumpectomy. I met with the patient and we discussed the procedure of
seed localization including benefits and alternatives. We discussed
the high likelihood of a successful procedure. We discussed the
risks of the procedure including infection, bleeding, tissue injury
and further surgery. We discussed the low dose of radioactivity
involved in the procedure. Informed, written consent was given.

The usual time-out protocol was performed immediately prior to the
procedure.

Using ultrasound guidance, sterile technique, 1% lidocaine and an
0-4U4 radioactive seed, biopsy-proven malignancy in the retroareolar
right breast was localized using a lateral approach. The follow-up
mammogram images confirm the seed in the expected location and were
marked for Dr. Codet.

Follow-up survey of the patient confirms presence of the radioactive
seed.

Order number of 0-4U4 seed:  [id number].

Total activity: 0.246 mCi reference Date: 01 May, 2018

The patient tolerated the procedure well and was released from the
[REDACTED]. She was given instructions regarding seed removal.
IMPRESSION: Radioactive seed localization right breast. No apparent
complications.

## 2021-02-12 ENCOUNTER — Other Ambulatory Visit: Payer: Self-pay | Admitting: Family Medicine

## 2021-02-12 DIAGNOSIS — I10 Essential (primary) hypertension: Secondary | ICD-10-CM

## 2021-03-08 ENCOUNTER — Telehealth: Payer: Self-pay | Admitting: Family Medicine

## 2021-03-08 NOTE — Telephone Encounter (Signed)
Left message for patient to call back and schedule Medicare Annual Wellness Visit (AWV) either virtually or in office. Left  my Herbie Drape number [phone]   Last AWV 03/03/20  please schedule at anytime with [facility] Nurse Health Advisor 1 or 2   This should be a 45 minute visit.

## 2021-04-06 ENCOUNTER — Other Ambulatory Visit: Payer: Medicare Other

## 2021-04-19 ENCOUNTER — Encounter: Payer: Self-pay | Admitting: Family Medicine

## 2021-04-19 ENCOUNTER — Ambulatory Visit (INDEPENDENT_AMBULATORY_CARE_PROVIDER_SITE_OTHER): Payer: Medicare Other | Admitting: Family Medicine

## 2021-04-19 VITALS — BP 152/80 | HR 67 | Temp 98.4°F | Ht 64.0 in | Wt 137.6 lb

## 2021-04-19 DIAGNOSIS — E785 Hyperlipidemia, unspecified: Secondary | ICD-10-CM

## 2021-04-19 DIAGNOSIS — I10 Essential (primary) hypertension: Secondary | ICD-10-CM

## 2021-04-19 DIAGNOSIS — E875 Hyperkalemia: Secondary | ICD-10-CM | POA: Diagnosis not present

## 2021-04-19 DIAGNOSIS — Z1159 Encounter for screening for other viral diseases: Secondary | ICD-10-CM | POA: Diagnosis not present

## 2021-04-19 LAB — CBC WITH DIFFERENTIAL/PLATELET
Basophils Absolute: 0.1 10*3/uL (ref 0.0–0.1)
Basophils Relative: 0.8 % (ref 0.0–3.0)
Eosinophils Absolute: 0.2 10*3/uL (ref 0.0–0.7)
Eosinophils Relative: 2.1 % (ref 0.0–5.0)
HCT: 44.9 % (ref 36.0–46.0)
Hemoglobin: 14.7 g/dL (ref 12.0–15.0)
Lymphocytes Relative: 28.1 % (ref 12.0–46.0)
Lymphs Abs: 2 10*3/uL (ref 0.7–4.0)
MCHC: 32.7 g/dL (ref 30.0–36.0)
MCV: 92.5 fl (ref 78.0–100.0)
Monocytes Absolute: 0.7 10*3/uL (ref 0.1–1.0)
Monocytes Relative: 10.1 % (ref 3.0–12.0)
Neutro Abs: 4.2 10*3/uL (ref 1.4–7.7)
Neutrophils Relative %: 58.9 % (ref 43.0–77.0)
Platelets: 289 10*3/uL (ref 150.0–400.0)
RBC: 4.85 Mil/uL (ref 3.87–5.11)
RDW: 13.4 % (ref 11.5–15.5)
WBC: 7.1 10*3/uL (ref 4.0–10.5)

## 2021-04-19 LAB — COMPREHENSIVE METABOLIC PANEL
ALT: 14 U/L (ref 0–35)
AST: 22 U/L (ref 0–37)
Albumin: 4.7 g/dL (ref 3.5–5.2)
Alkaline Phosphatase: 112 U/L (ref 39–117)
BUN: 16 mg/dL (ref 6–23)
CO2: 32 mEq/L (ref 19–32)
Calcium: 10.2 mg/dL (ref 8.4–10.5)
Chloride: 100 mEq/L (ref 96–112)
Creatinine, Ser: 0.79 mg/dL (ref 0.40–1.20)
GFR: 70.86 mL/min (ref >60.00)
Glucose, Bld: 87 mg/dL (ref 70–99)
Potassium: 5.3 mEq/L — ABNORMAL HIGH (ref 3.5–5.1)
Sodium: 139 mEq/L (ref 135–145)
Total Bilirubin: 0.7 mg/dL (ref 0.2–1.2)
Total Protein: 8.1 g/dL (ref 6.0–8.3)

## 2021-04-19 LAB — LIPID PANEL
Cholesterol: 226 mg/dL — ABNORMAL HIGH (ref 0–200)
HDL: 68.8 mg/dL (ref >39.00)
LDL Cholesterol: 142 mg/dL — ABNORMAL HIGH (ref 0–99)
NonHDL: 157.53
Total CHOL/HDL Ratio: 3
Triglycerides: 80 mg/dL (ref 0.0–149.0)
VLDL: 16 mg/dL (ref 0.0–40.0)

## 2021-04-19 NOTE — Progress Notes (Signed)
Kristina Bridges DOB: [birth year] Encounter date: 04/19/2021  This is a 81 y.o. female who presents with Chief Complaint  Patient presents with   Follow-up    History of present illness:  She had [employer] [facility] nurse out to the house and blood pressure looked good. Feeling great. Doing everything including all her yard work. Still has her cat and put house on patio. Stays on her schedule. Eating ok, appetite is good. No headaches. Sleeps well.   Does feel anxious when she has to come to the doctor's office.  States that she has always been this way, even as a child.  States that blood pressures outside of the office are always better than those readings we get in the office.  Her cuff was reading in line with her home visiting nurse from [place] [facility].   Allergies  Allergen Reactions   Anastrozole     Skin rashes    Hydrochlorothiazide     Rash; mild   Current Meds  Medication Sig   acetaminophen (TYLENOL) 500 MG tablet Take 500 mg by mouth as needed.   amLODipine (NORVASC) 5 MG tablet TAKE 1 TABLET(5 MG) BY MOUTH IN THE MORNING AND AT BEDTIME   losartan (COZAAR) 25 MG tablet TAKE 1 TABLET(25 MG) BY MOUTH DAILY   Multiple Vitamin (MULTIVITAMIN) capsule Take 1 capsule by mouth daily.    Review of Systems  Constitutional:  Negative for chills, fatigue and fever.  Respiratory:  Negative for cough, chest tightness, shortness of breath and wheezing.   Cardiovascular:  Negative for chest pain, palpitations and leg swelling.   Objective:  BP (!) 152/80 (BP Location: Right Arm, Cuff Size: Normal)    Pulse 67    Temp 98.4 F (36.9 C) (Oral)    Ht 5\' 4"  (1.626 m)    Wt 137 lb 9.6 oz (62.4 kg)    SpO2 97%    BMI 23.62 kg/m   Weight: 137 lb 9.6 oz (62.4 kg)   BP Readings from Last 3 Encounters:  04/19/21 (!) 152/80  10/14/20 140/70  03/20/20 (!) 144/80   Wt Readings from Last 3 Encounters:  04/19/21 137 lb 9.6 oz (62.4 kg)  10/14/20 134 lb 1.6 oz (60.8 kg)   03/20/20 127 lb 1.6 oz (57.7 kg)    Physical Exam Constitutional:      General: She is not in acute distress.    Appearance: She is well-developed.  Cardiovascular:     Rate and Rhythm: Normal rate and regular rhythm.     Heart sounds: Normal heart sounds. No murmur heard.   No friction rub.  Pulmonary:     Effort: Pulmonary effort is normal. No respiratory distress.     Breath sounds: Normal breath sounds. No wheezing or rales.  Musculoskeletal:     Right lower leg: No edema.     Left lower leg: No edema.  Neurological:     Mental Status: She is alert and oriented to person, place, and time.  Psychiatric:        Behavior: Behavior normal.    Assessment/Plan 1. Essential hypertension Blood pressure elevated today in the office, but she states that home blood pressure checked against [facility] home nurse was normal.  Her readings at home are always good when she checks them.  I will look for this paperwork from home visiting nurse to confirm.  Continue with current medications. She is otherwise feeling very well. Remains very active and independent.  - Comprehensive metabolic panel -  CBC with Differential/Platelet  2. Mild hyperlipidemia - Lipid panel  3. Need for hepatitis C screening test - Hepatitis C antibody   Return in about 6 months (around 10/17/2021) for Chronic condition visit.     Micheline Rough, MD

## 2021-04-19 NOTE — Patient Instructions (Signed)
I would like your blood pressure to be less than 140 for the top number and less than 90 for the bottom number on average. If you are above this number when checking at home on a regular basis, then let me know and we can adjust medications.

## 2021-04-20 LAB — HEPATITIS C ANTIBODY
Hepatitis C Ab: NONREACTIVE
SIGNAL TO CUT-OFF: 0.13 (ref <1.00)

## 2021-04-22 ENCOUNTER — Telehealth: Payer: Self-pay | Admitting: *Deleted

## 2021-04-22 NOTE — Telephone Encounter (Signed)
-----   Message from Caren Macadam, MD sent at 04/22/2021 10:45 AM EST ----- Patient stated home nurse from [employer] did bp on her. I don't believe I have seen this paperwork come through. Patient had copy at home of this. Can you just confirm what her blood pressures were when home nurse was out to see her? She states her bp is always better at home. Just want to document this.

## 2021-04-23 NOTE — Addendum Note (Signed)
Addended by: Agnes Lawrence on: 04/23/2021 10:07 AM   Modules accepted: Orders

## 2021-04-28 ENCOUNTER — Other Ambulatory Visit: Payer: Self-pay | Admitting: Family Medicine

## 2021-04-28 MED ORDER — ROSUVASTATIN CALCIUM 5 MG PO TABS: 2.5000 mg | ORAL_TABLET | Freq: Every day | ORAL | 3 refills | Status: DC

## 2021-04-28 NOTE — Telephone Encounter (Signed)
Home blood pressure from visiting nurse on patient was 120/71 and patient stated that this was confirmed with her own home cuff as well. No changes to medication as I would worry about low pressures with her. I suspect (as patient has told me) that she just has some white coat hypertension and elevates in the office. Keep monitoring at home.

## 2021-04-29 NOTE — Telephone Encounter (Signed)
Patient informed of the message below.

## 2021-04-30 ENCOUNTER — Other Ambulatory Visit (INDEPENDENT_AMBULATORY_CARE_PROVIDER_SITE_OTHER): Payer: Medicare Other

## 2021-04-30 DIAGNOSIS — E875 Hyperkalemia: Secondary | ICD-10-CM | POA: Diagnosis not present

## 2021-04-30 LAB — BASIC METABOLIC PANEL
BUN: 15 mg/dL (ref 6–23)
CO2: 29 mEq/L (ref 19–32)
Calcium: 9.7 mg/dL (ref 8.4–10.5)
Chloride: 102 mEq/L (ref 96–112)
Creatinine, Ser: 0.78 mg/dL (ref 0.40–1.20)
GFR: 71.94 mL/min (ref >60.00)
Glucose, Bld: 85 mg/dL (ref 70–99)
Potassium: 4.2 mEq/L (ref 3.5–5.1)
Sodium: 138 mEq/L (ref 135–145)

## 2021-05-14 ENCOUNTER — Telehealth: Payer: Self-pay | Admitting: Family Medicine

## 2021-05-14 NOTE — Telephone Encounter (Signed)
Spoke with patient to schedule Medicare Annual Wellness Visit (AWV) either virtually or in office. Left  my Herbie Drape number [phone]  Patient stated she had to much going on and would call back    Last AWV ;03/03/20  please schedule at anytime with [facility] Nurse Health Advisor 1 or 2   This should be a 45 minute visit.

## 2021-06-28 ENCOUNTER — Other Ambulatory Visit: Payer: Self-pay | Admitting: Family Medicine

## 2021-06-28 DIAGNOSIS — R921 Mammographic calcification found on diagnostic imaging of breast: Secondary | ICD-10-CM

## 2021-06-28 DIAGNOSIS — Z09 Encounter for follow-up examination after completed treatment for conditions other than malignant neoplasm: Secondary | ICD-10-CM

## 2021-07-13 ENCOUNTER — Telehealth: Payer: Self-pay | Admitting: Family Medicine

## 2021-07-13 NOTE — Telephone Encounter (Signed)
Correction ? ?Pt wanted to schedule when she has appt with new provider in aug ?

## 2021-07-13 NOTE — Telephone Encounter (Signed)
Left message for patient to call back and schedule Medicare Annual Wellness Visit (AWV) either virtually or in office. Left  my jabber number [phone]   Last AWV 03/03/20 ; please schedule at anytime with [facility] Nurse Health Advisor 1 or 2    

## 2021-07-15 ENCOUNTER — Ambulatory Visit
Admission: RE | Admit: 2021-07-15 | Discharge: 2021-07-15 | Disposition: A | Discharge status: Home / Self Care | Payer: Medicare Other | Source: Ambulatory Visit | Attending: Family Medicine | Admitting: Family Medicine

## 2021-07-15 DIAGNOSIS — Z09 Encounter for follow-up examination after completed treatment for conditions other than malignant neoplasm: Secondary | ICD-10-CM

## 2021-07-15 DIAGNOSIS — R921 Mammographic calcification found on diagnostic imaging of breast: Secondary | ICD-10-CM | POA: Diagnosis not present

## 2021-07-15 DIAGNOSIS — R922 Inconclusive mammogram: Secondary | ICD-10-CM | POA: Diagnosis not present

## 2021-08-09 ENCOUNTER — Other Ambulatory Visit: Payer: Self-pay | Admitting: Family Medicine

## 2021-10-18 ENCOUNTER — Other Ambulatory Visit: Payer: Self-pay | Admitting: *Deleted

## 2021-10-18 DIAGNOSIS — I10 Essential (primary) hypertension: Secondary | ICD-10-CM

## 2021-10-19 MED ORDER — AMLODIPINE BESYLATE 5 MG PO TABS: ORAL_TABLET | ORAL | 1 refills | Status: DC

## 2021-11-11 ENCOUNTER — Ambulatory Visit (INDEPENDENT_AMBULATORY_CARE_PROVIDER_SITE_OTHER): Payer: Medicare Other | Admitting: Family Medicine

## 2021-11-11 ENCOUNTER — Encounter: Payer: Self-pay | Admitting: Family Medicine

## 2021-11-11 VITALS — BP 160/64 | HR 70 | Temp 97.9°F | Ht 64.0 in | Wt 136.0 lb

## 2021-11-11 DIAGNOSIS — E785 Hyperlipidemia, unspecified: Secondary | ICD-10-CM

## 2021-11-11 DIAGNOSIS — I10 Essential (primary) hypertension: Secondary | ICD-10-CM | POA: Diagnosis not present

## 2021-11-11 LAB — LIPID PANEL
Cholesterol: 179 mg/dL (ref 0–200)
HDL: 65.8 mg/dL (ref >39.00)
LDL Cholesterol: 101 mg/dL — ABNORMAL HIGH (ref 0–99)
NonHDL: 112.79
Total CHOL/HDL Ratio: 3
Triglycerides: 59 mg/dL (ref 0.0–149.0)
VLDL: 11.8 mg/dL (ref 0.0–40.0)

## 2021-11-11 MED ORDER — LOSARTAN POTASSIUM 25 MG PO TABS: 50.0000 mg | ORAL_TABLET | Freq: Every day | ORAL | 1 refills | Status: DC

## 2021-11-11 NOTE — Patient Instructions (Addendum)
Increase your losartan to 50 mg daily (you make take 2 tablets daily of the 25 mg dose.)  Check your blood pressure every day and bring me the results in about 2-3 weeks.

## 2021-11-11 NOTE — Progress Notes (Signed)
Established Patient Office Visit  Subjective   Patient ID: Kristina Bridges, female    DOB: Jan 29, 1941  Age: 81 y.o. MRN: [id number]  Chief Complaint  Patient presents with   Establish Care    Patient is here for follow up of her HTN and HLD.  HTN - pt reports that she is taking the amlodipine 5 mg twice a day and the losartan once a day. States that she might have missed a dose of her losartan in the last couple of days, she denies chest pain, headaches, no dizziness. BP at home is checked at home but she did not bring her readings to the visit. States she is eating healthy foods and is walking in the neighborhood regularly every day.  HLD- on 2.5 mg of crestor. I reviewed her last lipid panel with her, her LDL is in the 140's but her TG and HDL are in the normal range. CVD risk factors include uncontrolled HTN and family history she is not a smoker, not a diabetic.    Patient Active Problem List   Diagnosis Date Noted   Genetic testing 05/04/2018   Family history of prostate cancer    Family history of cervical cancer    Family history of throat cancer    Family history of leukemia    Malignant neoplasm of upper-outer quadrant of right breast in female, estrogen receptor positive ([place]) 04/27/2018   Essential hypertension 07/15/2016   Seasonal allergic rhinitis 07/15/2016   Mild hyperlipidemia 07/15/2016      Review of Systems  All other systems reviewed and are negative.     Objective:     BP (!) 160/64 Comment: repeated by Mykal--jaf  Pulse 70   Temp 97.9 F (36.6 C) (Oral)   Ht '5\' 4"'$  (1.626 m)   Wt 136 lb (61.7 kg)   SpO2 96%   BMI 23.34 kg/m  BP Readings from Last 3 Encounters:  11/11/21 (!) 160/64  04/19/21 (!) 152/80  10/14/20 140/70      Physical Exam Vitals reviewed.  Constitutional:      Appearance: Normal appearance. She is well-groomed and normal weight.  HENT:     Head: Normocephalic and atraumatic.     Mouth/Throat:     Mouth:  Mucous membranes are moist.     Pharynx: Oropharynx is clear.  Eyes:     Extraocular Movements: Extraocular movements intact.     Conjunctiva/sclera: Conjunctivae normal.     Pupils: Pupils are equal, round, and reactive to light.  Cardiovascular:     Rate and Rhythm: Normal rate and regular rhythm.     Pulses: Normal pulses.     Heart sounds: S1 normal and S2 normal.  Pulmonary:     Effort: Pulmonary effort is normal.     Breath sounds: Normal breath sounds and air entry.  Abdominal:     General: Abdomen is flat. Bowel sounds are normal.     Palpations: Abdomen is soft.  Musculoskeletal:        General: Normal range of motion.     Right lower leg: No edema.     Left lower leg: No edema.  Skin:    General: Skin is warm and dry.  Neurological:     Mental Status: She is alert and oriented to person, place, and time. Mental status is at baseline.     Gait: Gait is intact.  Psychiatric:        Mood and Affect: Mood and affect normal.  Speech: Speech normal.        Behavior: Behavior normal.        Judgment: Judgment normal.     The ASCVD Risk score (Arnett DK, et al., 2019) failed to calculate for the following reasons:   The 2019 ASCVD risk score is only valid for ages 59 to 88    Assessment & Plan:   Problem List Items Addressed This Visit       Cardiovascular and Mediastinum   Essential hypertension - Primary    Currently uncontrolled on amlodipine 5 mg BID and losartan 25 mg daily. I advised that she increase her losartan to 50 mg daily and continue the amlodipine as previously prescribed. I gave her the warning signs of a stroke. I instructed her to check her BP readings daily and to being me her readings in 2-3 weeks so I can review. Will see her back in 3 months for a BP follow up.      Relevant Medications   losartan (COZAAR) 25 MG tablet     Other   Mild hyperlipidemia    LDL is not at goal with the 2.5 mg, patient is hesitant to increase her  medications, I advised we recheck her Lipid panel and if it is still uncontrolled then we will increase the crestor to 5 mg daily.       Relevant Medications   losartan (COZAAR) 25 MG tablet   Other Relevant Orders   Lipid Panel (Completed)    Return in about 3 months (around 02/11/2022) for  HTN recheck.    Farrel Conners, MD

## 2021-11-11 NOTE — Assessment & Plan Note (Signed)
Currently uncontrolled on amlodipine 5 mg BID and losartan 25 mg daily. I advised that she increase her losartan to 50 mg daily and continue the amlodipine as previously prescribed. I gave her the warning signs of a stroke. I instructed her to check her BP readings daily and to being me her readings in 2-3 weeks so I can review. Will see her back in 3 months for a BP follow up.

## 2021-11-11 NOTE — Assessment & Plan Note (Signed)
LDL is not at goal with the 2.5 mg, patient is hesitant to increase her medications, I advised we recheck her Lipid panel and if it is still uncontrolled then we will increase the crestor to 5 mg daily.

## 2021-11-15 ENCOUNTER — Telehealth: Payer: Self-pay | Admitting: Family Medicine

## 2021-11-15 NOTE — Telephone Encounter (Signed)
Spoke with patient to  schedule Medicare Annual Wellness Visit (AWV) either virtually or in office. Left  my Kristina Bridges number [phone]  Pt stated she would call back to schedule   Last AWV 03/03/20 ; please schedule at anytime with [facility] Nurse Health Advisor 1 or 2

## 2021-11-23 ENCOUNTER — Ambulatory Visit: Payer: Self-pay

## 2021-11-23 NOTE — Patient Outreach (Signed)
  Care Coordination   11/23/2021 Name: Kristina Bridges MRN: [id number] DOB: 06/09/1940   Care Coordination Outreach Attempts:  An unsuccessful telephone outreach was attempted today to offer the patient information about available care coordination services as a benefit of their health plan.   Follow Up Plan:  Additional outreach attempts will be made to offer the patient care coordination information and services.   Encounter Outcome:  No Answer  Care Coordination Interventions Activated:  No   Care Coordination Interventions:  No, not indicated    Daneen Schick, BSW, CDP Social Worker, Certified Dementia Practitioner Care Coordination [phone]

## 2021-12-02 ENCOUNTER — Other Ambulatory Visit: Payer: Self-pay | Admitting: *Deleted

## 2021-12-02 NOTE — Patient Outreach (Signed)
  Care Coordination   12/02/2021 Name: Gioia Ranes MRN: [id number] DOB: 31-May-1940   Care Coordination Outreach Attempts:  A second unsuccessful outreach was attempted today to offer the patient with information about available care coordination services as a benefit of their health plan.     Follow Up Plan:  Additional outreach attempts will be made to offer the patient care coordination information and services.   Encounter Outcome:  No Answer  Care Coordination Interventions Activated:  No   Care Coordination Interventions:  No, not indicated    Raina Mina, RN Care Management Coordinator [place] Office [phone]

## 2021-12-10 ENCOUNTER — Telehealth: Payer: Self-pay

## 2021-12-10 NOTE — Patient Outreach (Signed)
  Care Coordination   12/10/2021 Name: Kristina Bridges MRN: [id number] DOB: 12-17-1940   Care Coordination Outreach Attempts:  A third unsuccessful outreach was attempted today to offer the patient with information about available care coordination services as a benefit of their health plan.   Follow Up Plan:  No further outreach attempts will be made at this time. We have been unable to contact the patient to offer or enroll patient in care coordination services  Encounter Outcome:  No Answer  Care Coordination Interventions Activated:  No   Care Coordination Interventions:  No, not indicated    Daneen Schick, BSW, CDP Social Worker, Certified Dementia Practitioner Care Coordination [phone]

## 2022-01-04 ENCOUNTER — Telehealth: Payer: Self-pay | Admitting: Family Medicine

## 2022-01-04 NOTE — Telephone Encounter (Signed)
Left message for patient to call back and schedule Medicare Annual Wellness Visit (AWV) either virtually or in office. Left  my Herbie Drape number [phone]   Last AWV 03/03/20 ; please schedule at anytime with [facility] Nurse Health Advisor 1 or 2

## 2022-02-11 ENCOUNTER — Ambulatory Visit (INDEPENDENT_AMBULATORY_CARE_PROVIDER_SITE_OTHER): Payer: Medicare Other | Admitting: Family Medicine

## 2022-02-11 ENCOUNTER — Encounter: Payer: Self-pay | Admitting: Family Medicine

## 2022-02-11 VITALS — BP 136/78 | HR 90 | Temp 97.9°F | Ht 64.0 in | Wt 137.1 lb

## 2022-02-11 DIAGNOSIS — I10 Essential (primary) hypertension: Secondary | ICD-10-CM

## 2022-02-11 MED ORDER — LOSARTAN POTASSIUM 50 MG PO TABS: 50.0000 mg | ORAL_TABLET | Freq: Every day | ORAL | 3 refills | Status: DC

## 2022-02-11 NOTE — Assessment & Plan Note (Signed)
Current hypertension medications:       Sig   amLODipine (NORVASC) 5 MG tablet (Taking) TAKE 1 TABLET(5 MG) BY MOUTH IN THE MORNING AND AT BEDTIME   losartan (COZAAR) 50 MG tablet Take 1 tablet (50 mg total) by mouth daily.      Bp is now well controlled with the medication adjustment. Continue the above medications as prescribed. I will see her back in 6 months for follow up and refills.

## 2022-02-11 NOTE — Progress Notes (Signed)
Established Patient Office Visit  Subjective   Patient ID: Kristina Bridges, female    DOB: 05-Sep-1940  Age: 81 y.o. MRN: [id number]  Chief Complaint  Patient presents with   Follow-up    HTN -- BP in office performed and is well controlled. She  reports no side effects to the medications, no chest pain, SOB, dizziness or headaches. She has a BP cuff at home and is checking BP regularly, reports they are in the normal range. Patient was started on losartan at the last visit. Patient reports she is staying physically active every day. She reports that she is also watching what she is eating as well.       Current Outpatient Medications  Medication Instructions   acetaminophen (TYLENOL) 500 mg, Oral, As needed   amLODipine (NORVASC) 5 MG tablet TAKE 1 TABLET(5 MG) BY MOUTH IN THE MORNING AND AT BEDTIME   losartan (COZAAR) 50 mg, Oral, Daily   Multiple Vitamin (MULTIVITAMIN) capsule 1 capsule, Oral, Daily   rosuvastatin (CRESTOR) 2.5 mg, Oral, Daily     Patient Active Problem List   Diagnosis Date Noted   Genetic testing 05/04/2018   Family history of prostate cancer    Family history of cervical cancer    Family history of throat cancer    Family history of leukemia    Malignant neoplasm of upper-outer quadrant of right breast in female, estrogen receptor positive ([place]) 04/27/2018   Essential hypertension 07/15/2016   Seasonal allergic rhinitis 07/15/2016   Mild hyperlipidemia 07/15/2016      Review of Systems  All other systems reviewed and are negative.     Objective:     BP 136/78   Pulse 90   Temp 97.9 F (36.6 C) (Oral)   Ht '5\' 4"'$  (1.626 m)   Wt 137 lb 2 oz (62.2 kg)   SpO2 99%   BMI 23.54 kg/m    Physical Exam Vitals reviewed.  Constitutional:      Appearance: Normal appearance. She is well-groomed and normal weight.  Eyes:     Conjunctiva/sclera: Conjunctivae normal.  Cardiovascular:     Rate and Rhythm: Normal rate and regular rhythm.      Pulses: Normal pulses.     Heart sounds: S1 normal and S2 normal.  Pulmonary:     Effort: Pulmonary effort is normal.     Breath sounds: Normal breath sounds and air entry.  Abdominal:     General: Bowel sounds are normal.  Musculoskeletal:     Right lower leg: No edema.     Left lower leg: No edema.  Neurological:     Mental Status: She is alert and oriented to person, place, and time. Mental status is at baseline.     Gait: Gait is intact.  Psychiatric:        Mood and Affect: Mood and affect normal.        Speech: Speech normal.        Behavior: Behavior normal.        Judgment: Judgment normal.      No results found for any visits on 02/11/22.    The ASCVD Risk score (Arnett DK, et al., 2019) failed to calculate for the following reasons:   The 2019 ASCVD risk score is only valid for ages 34 to 16    Assessment & Plan:   Problem List Items Addressed This Visit       Cardiovascular and Mediastinum   Essential hypertension - Primary  Current hypertension medications:       Sig   amLODipine (NORVASC) 5 MG tablet (Taking) TAKE 1 TABLET(5 MG) BY MOUTH IN THE MORNING AND AT BEDTIME   losartan (COZAAR) 50 MG tablet Take 1 tablet (50 mg total) by mouth daily.     Bp is now well controlled with the medication adjustment. Continue the above medications as prescribed. I will see her back in 6 months for follow up and refills.       Relevant Medications   losartan (COZAAR) 50 MG tablet    Return in about 6 months (around 08/12/2022) for Follow up HTN and cholesterol.    Farrel Conners, MD

## 2022-04-12 ENCOUNTER — Telehealth: Payer: Self-pay | Admitting: Family Medicine

## 2022-04-12 NOTE — Telephone Encounter (Signed)
Left message for patient to call back and schedule Medicare Annual Wellness Visit (AWV) either virtually or in office. Left  my Herbie Drape number [phone]   Last AWV 03/03/20 please schedule with Nurse Health Adviser   45 min for awv-i and in office appointments 30 min for awv-s  phone/virtual appointments

## 2022-04-16 ENCOUNTER — Other Ambulatory Visit: Payer: Self-pay | Admitting: Family

## 2022-04-16 DIAGNOSIS — I10 Essential (primary) hypertension: Secondary | ICD-10-CM

## 2022-04-23 ENCOUNTER — Other Ambulatory Visit: Payer: Self-pay | Admitting: Family

## 2022-04-23 DIAGNOSIS — I10 Essential (primary) hypertension: Secondary | ICD-10-CM

## 2022-04-25 ENCOUNTER — Other Ambulatory Visit: Payer: Self-pay

## 2022-04-25 DIAGNOSIS — I10 Essential (primary) hypertension: Secondary | ICD-10-CM

## 2022-04-25 MED ORDER — AMLODIPINE BESYLATE 5 MG PO TABS: ORAL_TABLET | ORAL | 1 refills | Status: DC

## 2022-05-04 ENCOUNTER — Telehealth: Payer: Self-pay | Admitting: *Deleted

## 2022-05-04 NOTE — Patient Outreach (Signed)
  Care Coordination   05/04/2022 Name: Alianys Chacko MRN: [id number] DOB: Dec 12, 1940   Care Coordination Outreach Attempts:  An unsuccessful telephone outreach was attempted today to offer the patient information about available care coordination services as a benefit of their health plan.   Follow Up Plan:  Additional outreach attempts will be made to offer the patient care coordination information and services.   Encounter Outcome:  No Answer   Care Coordination Interventions:  No, not indicated    Raina Mina, RN Care Management Coordinator [place] Office [phone]

## 2022-05-30 ENCOUNTER — Other Ambulatory Visit: Payer: Self-pay | Admitting: *Deleted

## 2022-05-30 MED ORDER — ROSUVASTATIN CALCIUM 5 MG PO TABS: 2.5000 mg | ORAL_TABLET | Freq: Every day | ORAL | 0 refills | Status: DC

## 2022-05-31 ENCOUNTER — Telehealth: Payer: Self-pay | Admitting: Family Medicine

## 2022-05-31 NOTE — Telephone Encounter (Signed)
Called patient to schedule Medicare Annual Wellness Visit (AWV). Unable to reach patient.  Last date of AWV: 03/03/20  Please schedule an appointment at any time with [facility]  If any questions, please contact me at [phone].  Thank you ,  Barkley Boards AWV direct phone # [phone]

## 2022-08-12 ENCOUNTER — Encounter: Payer: Self-pay | Admitting: Family Medicine

## 2022-08-12 ENCOUNTER — Ambulatory Visit (INDEPENDENT_AMBULATORY_CARE_PROVIDER_SITE_OTHER): Payer: Medicare Other | Admitting: Family Medicine

## 2022-08-12 VITALS — BP 140/80 | HR 84 | Temp 98.6°F | Ht 64.0 in | Wt 141.2 lb

## 2022-08-12 DIAGNOSIS — I10 Essential (primary) hypertension: Secondary | ICD-10-CM

## 2022-08-12 DIAGNOSIS — Z1231 Encounter for screening mammogram for malignant neoplasm of breast: Secondary | ICD-10-CM

## 2022-08-12 DIAGNOSIS — E785 Hyperlipidemia, unspecified: Secondary | ICD-10-CM | POA: Diagnosis not present

## 2022-08-12 LAB — LIPID PANEL
Cholesterol: 157 mg/dL (ref 0–200)
HDL: 64.8 mg/dL (ref >39.00)
LDL Cholesterol: 80 mg/dL (ref 0–99)
NonHDL: 92.58
Total CHOL/HDL Ratio: 2
Triglycerides: 61 mg/dL (ref 0.0–149.0)
VLDL: 12.2 mg/dL (ref 0.0–40.0)

## 2022-08-12 LAB — COMPREHENSIVE METABOLIC PANEL WITH GFR
ALT: 14 U/L (ref 0–35)
AST: 23 U/L (ref 0–37)
Albumin: 4.3 g/dL (ref 3.5–5.2)
Alkaline Phosphatase: 115 U/L (ref 39–117)
BUN: 21 mg/dL (ref 6–23)
CO2: 29 meq/L (ref 19–32)
Calcium: 9.7 mg/dL (ref 8.4–10.5)
Chloride: 100 meq/L (ref 96–112)
Creatinine, Ser: 0.92 mg/dL (ref 0.40–1.20)
GFR: 58.48 mL/min — ABNORMAL LOW (ref >60.00)
Glucose, Bld: 81 mg/dL (ref 70–99)
Potassium: 4.4 meq/L (ref 3.5–5.1)
Sodium: 138 meq/L (ref 135–145)
Total Bilirubin: 0.6 mg/dL (ref 0.2–1.2)
Total Protein: 7.6 g/dL (ref 6.0–8.3)

## 2022-08-12 LAB — TSH: TSH: 1.49 u[IU]/mL (ref 0.35–5.50)

## 2022-08-12 MED ORDER — ROSUVASTATIN CALCIUM 5 MG PO TABS: 2.5000 mg | ORAL_TABLET | Freq: Every day | ORAL | 3 refills | Status: DC

## 2022-08-12 NOTE — Progress Notes (Signed)
Established Patient Office Visit  Subjective   Patient ID: Kristina Bridges, female    DOB: 10-Apr-1941  Age: 82 y.o. MRN: [id number]  Chief Complaint  Patient presents with   Medical Management of Chronic Issues    Pt is here for 6 month follow up today. She reports she feels well, no changes in her health since the last visit.   HTN -- BP in office performed and is borderline elevated today. She  reports no side effects to the medications, no chest pain, SOB, dizziness or headaches. She has a BP cuff at home and is checking BP regularly, reports they are in the normal range at home. States she was "rushing around" this morning before her appointment. Due for CMP today.  HLD-- on rosuvastatin 2.5 mg daily, needs refills today and new lipid panel. She denies any muscle cramps or other issues with the medication  Reviewed HM, patient declined DEXA scan but she is agreeable to getting her mammogram    Current Outpatient Medications  Medication Instructions   acetaminophen (TYLENOL) 500 mg, Oral, As needed   amLODipine (NORVASC) 5 MG tablet TAKE 1 TABLET(5 MG) BY MOUTH IN THE MORNING AND AT BEDTIME   losartan (COZAAR) 50 mg, Oral, Daily   Multiple Vitamin (MULTIVITAMIN) capsule 1 capsule, Oral, Daily   rosuvastatin (CRESTOR) 2.5 mg, Oral, Daily    Patient Active Problem List   Diagnosis Date Noted   Genetic testing 05/04/2018   Family history of prostate cancer    Family history of cervical cancer    Family history of throat cancer    Family history of leukemia    Malignant neoplasm of upper-outer quadrant of right breast in female, estrogen receptor positive ([facility]) 04/27/2018   Essential hypertension 07/15/2016   Seasonal allergic rhinitis 07/15/2016   Mild hyperlipidemia 07/15/2016      Review of Systems  All other systems reviewed and are negative.     Objective:     BP (!) 140/80 (BP Location: Right Arm, Patient Position: Sitting, Cuff Size: Normal)    Pulse 84   Temp 98.6 F (37 C) (Oral)   Ht 5\' 4"  (1.626 m)   Wt 141 lb 3.2 oz (64 kg)   SpO2 100%   BMI 24.24 kg/m  BP Readings from Last 3 Encounters:  08/12/22 (!) 140/80  02/11/22 136/78  11/11/21 (!) 160/64      Physical Exam Vitals reviewed.  Constitutional:      Appearance: Normal appearance. She is well-groomed and normal weight.  Neck:     Thyroid: No thyromegaly.  Cardiovascular:     Rate and Rhythm: Normal rate and regular rhythm.     Pulses: Normal pulses.     Heart sounds: S1 normal and S2 normal.  Pulmonary:     Effort: Pulmonary effort is normal.     Breath sounds: Normal breath sounds and air entry.  Musculoskeletal:     Right lower leg: No edema.     Left lower leg: No edema.  Neurological:     Mental Status: She is alert and oriented to person, place, and time. Mental status is at baseline.     Gait: Gait is intact.  Psychiatric:        Mood and Affect: Mood and affect normal.        Speech: Speech normal.        Behavior: Behavior normal.        Judgment: Judgment normal.      No  results found for any visits on 08/12/22.    The ASCVD Risk score (Arnett DK, et al., 2019) failed to calculate for the following reasons:   The 2019 ASCVD risk score is only valid for ages 55 to 61    Assessment & Plan:  Essential hypertension Assessment & Plan: BP is borderline elevated today at 140/80. Pt states that her readings at home are in the normal range. I encouraged her to continue checking her BP daily at home, if her BP is over 140 then she was instructed to call me. Checking CMP today, RTC 6 months. Continue medications listed below.  Current hypertension medications:       Sig   amLODipine (NORVASC) 5 MG tablet (Taking) TAKE 1 TABLET(5 MG) BY MOUTH IN THE MORNING AND AT BEDTIME   losartan (COZAAR) 50 MG tablet (Taking) Take 1 tablet (50 mg total) by mouth daily.        Orders: -     Comprehensive metabolic panel -     TSH  Mild  hyperlipidemia Assessment & Plan: Needs new lipid panel today, refilled rosuvastatin also.   Orders: -     Rosuvastatin Calcium; Take 0.5 tablets (2.5 mg total) by mouth daily.  Dispense: 90 tablet; Refill: 3 -     Lipid panel  Breast cancer screening by mammogram -     Digital Screening Mammogram, Left and Right; Future     Return in about 6 months (around 02/12/2023) for HTN.    Karie Georges, MD

## 2022-08-12 NOTE — Assessment & Plan Note (Addendum)
BP is borderline elevated today at 140/80. Pt states that her readings at home are in the normal range. I encouraged her to continue checking her BP daily at home, if her BP is over 140 then she was instructed to call me. Checking CMP today, RTC 6 months. Continue medications listed below.  Current hypertension medications:       Sig   amLODipine (NORVASC) 5 MG tablet (Taking) TAKE 1 TABLET(5 MG) BY MOUTH IN THE MORNING AND AT BEDTIME   losartan (COZAAR) 50 MG tablet (Taking) Take 1 tablet (50 mg total) by mouth daily.

## 2022-08-12 NOTE — Assessment & Plan Note (Signed)
Needs new lipid panel today, refilled rosuvastatin also.

## 2022-08-15 ENCOUNTER — Telehealth: Payer: Self-pay | Admitting: Family Medicine

## 2022-08-15 NOTE — Telephone Encounter (Signed)
Contacted Kristina Bridges to schedule their annual wellness visit. Patient declined to schedule AWV at this time.  Kristina Bridges AWV direct phone # [phone]  Spoke with patient to schedule AWV.  She declined stating she did not want to schedule at this time and would call back to schedule

## 2022-09-16 ENCOUNTER — Ambulatory Visit: Payer: Medicare Other

## 2022-10-20 ENCOUNTER — Other Ambulatory Visit: Payer: Self-pay | Admitting: Family Medicine

## 2022-10-20 DIAGNOSIS — I10 Essential (primary) hypertension: Secondary | ICD-10-CM

## 2022-11-01 ENCOUNTER — Ambulatory Visit
Admission: RE | Admit: 2022-11-01 | Discharge: 2022-11-01 | Disposition: A | Discharge status: Home / Self Care | Payer: Medicare Other | Source: Ambulatory Visit | Attending: Family Medicine | Admitting: Family Medicine

## 2022-11-01 DIAGNOSIS — Z1231 Encounter for screening mammogram for malignant neoplasm of breast: Secondary | ICD-10-CM | POA: Diagnosis not present

## 2023-02-06 ENCOUNTER — Other Ambulatory Visit: Payer: Self-pay | Admitting: Family Medicine

## 2023-02-06 DIAGNOSIS — I10 Essential (primary) hypertension: Secondary | ICD-10-CM

## 2023-02-09 ENCOUNTER — Other Ambulatory Visit: Payer: Self-pay | Admitting: Family Medicine

## 2023-02-09 DIAGNOSIS — I10 Essential (primary) hypertension: Secondary | ICD-10-CM

## 2023-02-09 NOTE — Telephone Encounter (Signed)
Pt checking on progress of this refill request. Pt took her last one last night asking that this be filled asap.

## 2023-02-10 ENCOUNTER — Other Ambulatory Visit: Payer: Self-pay

## 2023-02-10 DIAGNOSIS — I10 Essential (primary) hypertension: Secondary | ICD-10-CM

## 2023-02-10 MED ORDER — LOSARTAN POTASSIUM 50 MG PO TABS
50.0000 mg | ORAL_TABLET | Freq: Every day | ORAL | 3 refills | Status: DC
Start: 2023-02-10 — End: 2024-02-08

## 2023-02-13 ENCOUNTER — Encounter: Payer: Self-pay | Admitting: Family Medicine

## 2023-02-13 ENCOUNTER — Ambulatory Visit (INDEPENDENT_AMBULATORY_CARE_PROVIDER_SITE_OTHER): Payer: Medicare Other | Admitting: Family Medicine

## 2023-02-13 VITALS — BP 164/70 | HR 88 | Temp 98.6°F | Ht 64.0 in | Wt 140.1 lb

## 2023-02-13 DIAGNOSIS — I10 Essential (primary) hypertension: Secondary | ICD-10-CM

## 2023-02-13 NOTE — Assessment & Plan Note (Signed)
BP is high today because she is out of her losartan -- it was sent in on 11/1-- will call the pharmacy to make sure they received the medication. Her home BP readings are in the normal range. Will continue her current regimen.

## 2023-02-13 NOTE — Progress Notes (Unsigned)
Established Patient Office Visit  Subjective   Patient ID: Kristina Bridges, female    DOB: 12/24/1940  Age: 82 y.o. MRN: [id number]  Chief Complaint  Patient presents with  . Medical Management of Chronic Issues    Pt is here for follow up today on her HTN. She reports that she was out of her losartan for about 4 days. States that she went on Friday to get her medication but was told it hadn't been called in yet. States that she is on her amlodipine still. BP is elevated today likely because she is not on her losartan. She denies any chest pain or SOB, no headaches. Patient has been checking her blood pressure at home once a day and she brought in the readings.    Current Outpatient Medications  Medication Instructions  . acetaminophen (TYLENOL) 500 mg, Oral, As needed  . amLODipine (NORVASC) 5 MG tablet TAKE 1 TABLET(5 MG) BY MOUTH IN THE MORNING AND AT BEDTIME  . losartan (COZAAR) 50 mg, Oral, Daily  . Multiple Vitamin (MULTIVITAMIN) capsule 1 capsule, Oral, Daily  . rosuvastatin (CRESTOR) 2.5 mg, Oral, Daily    Patient Active Problem List   Diagnosis Date Noted  . Genetic testing 05/04/2018  . Family history of prostate cancer   . Family history of cervical cancer   . Family history of throat cancer   . Family history of leukemia   . Malignant neoplasm of upper-outer quadrant of right breast in female, estrogen receptor positive ([facility]) 04/27/2018  . Essential hypertension 07/15/2016  . Seasonal allergic rhinitis 07/15/2016  . Mild hyperlipidemia 07/15/2016      Review of Systems  All other systems reviewed and are negative.     Objective:     BP (!) 164/70 (BP Location: Right Arm, Patient Position: Sitting, Cuff Size: Normal)   Pulse 88   Temp 98.6 F (37 C) (Oral)   Ht 5\' 4"  (1.626 m)   Wt 140 lb 1.6 oz (63.5 kg)   SpO2 99%   BMI 24.05 kg/m  {Vitals History (Optional):23777}  Physical Exam Vitals reviewed.  Constitutional:      Appearance:  Normal appearance. She is well-groomed and normal weight.  Eyes:     Conjunctiva/sclera: Conjunctivae normal.  Neck:     Thyroid: No thyromegaly.  Cardiovascular:     Rate and Rhythm: Normal rate and regular rhythm.     Pulses: Normal pulses.     Heart sounds: S1 normal and S2 normal.  Pulmonary:     Effort: Pulmonary effort is normal.     Breath sounds: Normal breath sounds and air entry.  Abdominal:     General: Bowel sounds are normal.  Musculoskeletal:     Right lower leg: No edema.     Left lower leg: No edema.  Neurological:     Mental Status: She is alert and oriented to person, place, and time. Mental status is at baseline.     Gait: Gait is intact.  Psychiatric:        Mood and Affect: Mood and affect normal.        Speech: Speech normal.        Behavior: Behavior normal.        Judgment: Judgment normal.     No results found for any visits on 02/13/23.  Last metabolic panel Lab Results  Component Value Date   GLUCOSE 81 08/12/2022   NA 138 08/12/2022   K 4.4 08/12/2022   CL 100 08/12/2022  CO2 29 08/12/2022   BUN 21 08/12/2022   CREATININE 0.92 08/12/2022   GFR 58.48 (L) 08/12/2022   CALCIUM 9.7 08/12/2022   PROT 7.6 08/12/2022   ALBUMIN 4.3 08/12/2022   BILITOT 0.6 08/12/2022   ALKPHOS 115 08/12/2022   AST 23 08/12/2022   ALT 14 08/12/2022   ANIONGAP 9 05/03/2019   Last lipids Lab Results  Component Value Date   CHOL 157 08/12/2022   HDL 64.80 08/12/2022   LDLCALC 80 08/12/2022   TRIG 61.0 08/12/2022   CHOLHDL 2 08/12/2022      The ASCVD Risk score (Arnett DK, et al., 2019) failed to calculate for the following reasons:   The 2019 ASCVD risk score is only valid for ages 58 to 57    Assessment & Plan:  Essential hypertension Assessment & Plan: BP is high today because she is out of her losartan -- it was sent in on 11/1-- will call the pharmacy to make sure they received the medication. Her home BP readings are in the normal range. Will  continue her current regimen.       Return in about 6 months (around 08/13/2023) for HTN.    Karie Georges, MD

## 2023-04-15 ENCOUNTER — Other Ambulatory Visit: Payer: Self-pay | Admitting: Family Medicine

## 2023-04-15 DIAGNOSIS — I10 Essential (primary) hypertension: Secondary | ICD-10-CM

## 2023-08-14 ENCOUNTER — Ambulatory Visit (INDEPENDENT_AMBULATORY_CARE_PROVIDER_SITE_OTHER): Payer: Medicare Other | Admitting: Family Medicine

## 2023-08-14 VITALS — BP 165/85 | HR 67 | Temp 98.4°F | Ht 64.0 in | Wt 140.1 lb

## 2023-08-14 DIAGNOSIS — Z131 Encounter for screening for diabetes mellitus: Secondary | ICD-10-CM

## 2023-08-14 DIAGNOSIS — E785 Hyperlipidemia, unspecified: Secondary | ICD-10-CM | POA: Diagnosis not present

## 2023-08-14 DIAGNOSIS — I10 Essential (primary) hypertension: Secondary | ICD-10-CM

## 2023-08-14 LAB — COMPREHENSIVE METABOLIC PANEL WITH GFR
ALT: 15 U/L (ref 0–35)
AST: 22 U/L (ref 0–37)
Albumin: 4.5 g/dL (ref 3.5–5.2)
Alkaline Phosphatase: 105 U/L (ref 39–117)
BUN: 19 mg/dL (ref 6–23)
CO2: 28 meq/L (ref 19–32)
Calcium: 9.7 mg/dL (ref 8.4–10.5)
Chloride: 100 meq/L (ref 96–112)
Creatinine, Ser: 0.85 mg/dL (ref 0.40–1.20)
GFR: 63.86 mL/min (ref >60.00)
Glucose, Bld: 81 mg/dL (ref 70–99)
Potassium: 4.2 meq/L (ref 3.5–5.1)
Sodium: 138 meq/L (ref 135–145)
Total Bilirubin: 0.6 mg/dL (ref 0.2–1.2)
Total Protein: 7.5 g/dL (ref 6.0–8.3)

## 2023-08-14 LAB — LIPID PANEL
Cholesterol: 178 mg/dL (ref 0–200)
HDL: 62.1 mg/dL (ref >39.00)
LDL Cholesterol: 102 mg/dL — ABNORMAL HIGH (ref 0–99)
NonHDL: 116.3
Total CHOL/HDL Ratio: 3
Triglycerides: 74 mg/dL (ref 0.0–149.0)
VLDL: 14.8 mg/dL (ref 0.0–40.0)

## 2023-08-14 LAB — HEMOGLOBIN A1C: Hgb A1c MFr Bld: 5.5 % (ref 4.6–6.5)

## 2023-08-14 MED ORDER — ROSUVASTATIN CALCIUM 5 MG PO TABS: 2.5000 mg | ORAL_TABLET | Freq: Every day | ORAL | 3 refills | Status: AC

## 2023-08-14 MED ORDER — AMLODIPINE BESYLATE 5 MG PO TABS: ORAL_TABLET | ORAL | 1 refills | Status: DC

## 2023-08-14 NOTE — Assessment & Plan Note (Signed)
 BP is elevated today-- pt states because of her brother's illness, I recommended that we increase her BP medication however pt does not wish to do so at this itme. I will check her yearly labs and look at renal function, then will attempt to discuss this again with the patient.

## 2023-08-14 NOTE — Progress Notes (Signed)
 Established Patient Office Visit  Subjective   Patient ID: Kristina Bridges, female    DOB: 1940/08/21  Age: 83 y.o. MRN: [id number]  Chief Complaint  Patient presents with   Medical Management of Chronic Issues    Pt  is here for follow up on her blood pressure. She reports that her brother is in the hospital -- he recently had a stroke and she is stressed about his health. States that at home she has been getting readings in the 150/80's, I explained to her that that is still too high, however she is very hesitant to go up on the strength of her blood pressure medication. She denies any chest pain, no swelling, no dizziness or headaches. 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 No problems with physical activity,   Pt reports compliance with her medications, denies any muscle cramps with the statin medication. BP recheck in office today was 165/85 Current Outpatient Medications  Medication Instructions   acetaminophen  (TYLENOL ) 500 mg, As needed   amLODipine  (NORVASC ) 5 MG tablet TAKE 1 TABLET(5 MG) BY MOUTH IN THE MORNING AND AT BEDTIME   losartan  (COZAAR ) 50 mg, Oral, Daily   Multiple Vitamin (MULTIVITAMIN) capsule 1 capsule, Daily   rosuvastatin  (CRESTOR ) 2.5 mg, Oral, Daily    Patient Active Problem List   Diagnosis Date Noted   Genetic testing 05/04/2018   Family history of prostate cancer    Family history of cervical cancer    Family history of throat cancer    Family history of leukemia    Malignant neoplasm of upper-outer quadrant of right breast in female, estrogen receptor positive (HCC) 04/27/2018   Essential hypertension 07/15/2016   Seasonal allergic rhinitis 07/15/2016   Mild hyperlipidemia 07/15/2016      Review of Systems  All other systems reviewed and are negative.     Objective:     BP (!) 165/85 (BP Location: Right Arm, Patient Position: Sitting, Cuff Size: Normal)   Pulse 67   Temp 98.4 F (36.9 C) (Oral)   Ht 5\' 4"  (1.626 m)   Wt  140 lb 1.6 oz (63.5 kg)   SpO2 100%   BMI 24.05 kg/m    Physical Exam Vitals reviewed.  Constitutional:      Appearance: Normal appearance. She is well-groomed and normal weight.  Cardiovascular:     Rate and Rhythm: Normal rate and regular rhythm.     Heart sounds: S1 normal and S2 normal.  Pulmonary:     Effort: Pulmonary effort is normal.     Breath sounds: Normal breath sounds and air entry. No wheezing.  Musculoskeletal:        General: Normal range of motion.     Right lower leg: Edema (mild BL ankles) present.     Left lower leg: Edema present.  Skin:    General: Skin is warm and dry.  Neurological:     Mental Status: She is alert and oriented to person, place, and time. Mental status is at baseline.     Gait: Gait is intact.  Psychiatric:        Mood and Affect: Mood and affect normal.        Speech: Speech normal.        Behavior: Behavior normal.      No results found for any visits on 08/14/23.    The ASCVD Risk score (Arnett DK, et al., 2019) failed to calculate for the following reasons:   The 2019 ASCVD risk score  is only valid for ages 26 to 36    Assessment & Plan:  Mild hyperlipidemia Assessment & Plan: On rosuvastatin , will check new lipid panel for annual surveillance.   Orders: -     Lipid panel; Future -     Rosuvastatin  Calcium ; Take 0.5 tablets (2.5 mg total) by mouth daily.  Dispense: 90 tablet; Refill: 3  Essential hypertension Assessment & Plan: BP is elevated today-- pt states because of her brother's illness, I recommended that we increase her BP medication however pt does not wish to do so at this itme. I will check her yearly labs and look at renal function, then will attempt to discuss this again with the patient.  Orders: -     Comprehensive metabolic panel with GFR; Future -     amLODIPine  Besylate; TAKE 1 TABLET(5 MG) BY MOUTH IN THE MORNING AND AT BEDTIME  Dispense: 180 tablet; Refill: 1  Diabetes mellitus screening -      Hemoglobin A1c; Future     No follow-ups on file.    Aida House, MD

## 2023-08-14 NOTE — Assessment & Plan Note (Signed)
 On rosuvastatin , will check new lipid panel for annual surveillance.

## 2023-08-15 ENCOUNTER — Telehealth: Payer: Self-pay

## 2023-08-15 NOTE — Telephone Encounter (Signed)
 Copied from CRM [id number]. Topic: Clinical - Lab/Test Results >> Aug 15, 2023  1:53 PM Trula Gable C wrote: Reason for CRM: Patient call in to speak Joann , informed her per note about her lab results, patient understood and had no question   Wanted to let them know that once she got home her blood pressure was normal and has been normal all day

## 2024-02-05 ENCOUNTER — Other Ambulatory Visit: Payer: Self-pay | Admitting: Family Medicine

## 2024-02-05 DIAGNOSIS — I10 Essential (primary) hypertension: Secondary | ICD-10-CM

## 2024-02-14 ENCOUNTER — Ambulatory Visit: Admitting: Family Medicine

## 2024-02-14 ENCOUNTER — Encounter: Payer: Self-pay | Admitting: Family Medicine

## 2024-02-14 VITALS — BP 152/72 | HR 80 | Temp 98.3°F | Ht 64.0 in | Wt 135.5 lb

## 2024-02-14 DIAGNOSIS — I10 Essential (primary) hypertension: Secondary | ICD-10-CM

## 2024-02-14 MED ORDER — AMLODIPINE BESYLATE 5 MG PO TABS: ORAL_TABLET | ORAL | 1 refills | Status: AC

## 2024-02-14 MED ORDER — LOSARTAN POTASSIUM 50 MG PO TABS: 50.0000 mg | ORAL_TABLET | Freq: Every day | ORAL | 1 refills | Status: AC

## 2024-02-14 NOTE — Progress Notes (Signed)
   Established Patient Office Visit  Subjective   Patient ID: Kristina Bridges, female    DOB: 1941-04-07  Age: 83 y.o. MRN: [id number]  Chief Complaint  Patient presents with   Medical Management of Chronic Issues    HPI   Current Outpatient Medications  Medication Instructions   acetaminophen  (TYLENOL ) 500 mg, As needed   amLODipine  (NORVASC ) 5 MG tablet TAKE 1 TABLET(5 MG) BY MOUTH IN THE MORNING AND AT BEDTIME   losartan  (COZAAR ) 50 mg, Oral, Daily   Multiple Vitamin (MULTIVITAMIN) capsule 1 capsule, Daily   rosuvastatin  (CRESTOR ) 2.5 mg, Oral, Daily    Patient Active Problem List   Diagnosis Date Noted   Genetic testing 05/04/2018   Family history of prostate cancer    Family history of cervical cancer    Family history of throat cancer    Family history of leukemia    Malignant neoplasm of upper-outer quadrant of right breast in female, estrogen receptor positive (HCC) 04/27/2018   Essential hypertension 07/15/2016   Seasonal allergic rhinitis 07/15/2016   Mild hyperlipidemia 07/15/2016     Review of Systems  All other systems reviewed and are negative.     Objective:     BP (!) 152/72   Pulse 80   Temp 98.3 F (36.8 C) (Oral)   Ht 5' 4 (1.626 m)   Wt 135 lb 8 oz (61.5 kg)   SpO2 98%   BMI 23.26 kg/m    Physical Exam Vitals reviewed.  Constitutional:      Appearance: Normal appearance. She is normal weight.  Cardiovascular:     Rate and Rhythm: Normal rate and regular rhythm.     Pulses: Normal pulses.     Heart sounds: Normal heart sounds. No murmur heard. Neurological:     Mental Status: She is alert.      No results found for any visits on 02/14/24.    The ASCVD Risk score (Arnett DK, et al., 2019) failed to calculate for the following reasons:   The 2019 ASCVD risk score is only valid for ages 41 to 42    Assessment & Plan:  Essential hypertension -     Losartan  Potassium; Take 1 tablet (50 mg total) by mouth daily.   Dispense: 90 tablet; Refill: 1 -     amLODIPine  Besylate; TAKE 1 TABLET(5 MG) BY MOUTH IN THE MORNING AND AT BEDTIME  Dispense: 180 tablet; Refill: 1   Assessment and Plan    Essential hypertension Blood pressure readings at home are within normal range, with systolic less than 140 and diastolic less than 80. Office readings are elevated, likely due to situational factors such as talking during measurement. No symptoms of dizziness, headaches, or chest pain reported. Current management with amlodipine  is effective as long as home readings remain normal. - Continue current antihypertensive regimen with amlodipine . - Refilled amlodipine  prescription. - Monitor blood pressure at home regularly. - Will recheck blood pressure in the office during next visit.  General Health Maintenance Declined flu vaccination despite discussion of its benefits. Regular self-breast exams performed, with plans to resume mammograms next year. Declined bone density test despite previous discussions. - Encouraged continuation of regular self-breast exams. - Discussed flu vaccination benefits and offered administration if she changes her mind. - Will plan for mammogram next year. - Respected decision to decline bone density test.        Return in about 6 months (around 08/13/2024).    Heron CHRISTELLA Sharper, MD

## 2024-08-13 ENCOUNTER — Ambulatory Visit: Admitting: Family Medicine
